# Patient Record
Sex: Male | Born: 1935 | Race: White | Hispanic: Yes | Smoking: Former smoker
Health system: Southern US, Community
[De-identification: ages and names within clinical notes are randomized; demographics above are authoritative.]

## PROBLEM LIST (undated history)

## (undated) DIAGNOSIS — I10 Essential (primary) hypertension: Secondary | ICD-10-CM

## (undated) DIAGNOSIS — E039 Hypothyroidism, unspecified: Secondary | ICD-10-CM

## (undated) DIAGNOSIS — I723 Aneurysm of iliac artery: Secondary | ICD-10-CM

## (undated) DIAGNOSIS — R600 Localized edema: Secondary | ICD-10-CM

## (undated) DIAGNOSIS — R609 Edema, unspecified: Secondary | ICD-10-CM

## (undated) DIAGNOSIS — E669 Obesity, unspecified: Secondary | ICD-10-CM

## (undated) DIAGNOSIS — G4733 Obstructive sleep apnea (adult) (pediatric): Secondary | ICD-10-CM

## (undated) DIAGNOSIS — E119 Type 2 diabetes mellitus without complications: Secondary | ICD-10-CM

## (undated) DIAGNOSIS — E785 Hyperlipidemia, unspecified: Secondary | ICD-10-CM

---

## 2009-02-10 ENCOUNTER — Encounter: Admission: RE | Admit: 2009-02-10 | Discharge: 2009-02-10 | Payer: Self-pay | Admitting: Specialist

## 2012-02-28 ENCOUNTER — Ambulatory Visit
Admission: RE | Admit: 2012-02-28 | Discharge: 2012-02-28 | Disposition: A | Discharge status: Home / Self Care | Payer: Medicare Other | Source: Ambulatory Visit | Attending: Physician Assistant | Admitting: Physician Assistant

## 2012-02-28 ENCOUNTER — Other Ambulatory Visit: Payer: Self-pay | Admitting: Physician Assistant

## 2012-02-28 DIAGNOSIS — Z801 Family history of malignant neoplasm of trachea, bronchus and lung: Secondary | ICD-10-CM

## 2012-02-28 DIAGNOSIS — F17200 Nicotine dependence, unspecified, uncomplicated: Secondary | ICD-10-CM

## 2012-11-23 ENCOUNTER — Other Ambulatory Visit: Payer: Self-pay | Admitting: Otolaryngology

## 2012-11-23 DIAGNOSIS — E041 Nontoxic single thyroid nodule: Secondary | ICD-10-CM

## 2012-11-28 ENCOUNTER — Ambulatory Visit
Admission: RE | Admit: 2012-11-28 | Discharge: 2012-11-28 | Disposition: A | Discharge status: Home / Self Care | Payer: Medicare Other | Source: Ambulatory Visit | Attending: Otolaryngology | Admitting: Otolaryngology

## 2012-11-28 ENCOUNTER — Other Ambulatory Visit (HOSPITAL_COMMUNITY)
Admission: RE | Admit: 2012-11-28 | Discharge: 2012-11-28 | Disposition: A | Discharge status: Home / Self Care | Payer: Medicare Other | Source: Ambulatory Visit | Attending: Interventional Radiology | Admitting: Interventional Radiology

## 2012-11-28 DIAGNOSIS — E041 Nontoxic single thyroid nodule: Secondary | ICD-10-CM

## 2019-03-16 HISTORY — PX: LEFT HEART CATH AND CORONARY ANGIOGRAPHY: CATH118249

## 2021-03-21 ENCOUNTER — Inpatient Hospital Stay (HOSPITAL_COMMUNITY)
Admission: EM | Admit: 2021-03-21 | Discharge: 2021-03-25 | DRG: 064 | Disposition: A | Discharge status: Rehabilitation Facility | Payer: Medicare Other | Attending: Internal Medicine | Admitting: Internal Medicine

## 2021-03-21 ENCOUNTER — Emergency Department (HOSPITAL_COMMUNITY): Payer: Medicare Other

## 2021-03-21 ENCOUNTER — Encounter (HOSPITAL_COMMUNITY): Payer: Self-pay | Admitting: Emergency Medicine

## 2021-03-21 DIAGNOSIS — G4733 Obstructive sleep apnea (adult) (pediatric): Secondary | ICD-10-CM | POA: Diagnosis present

## 2021-03-21 DIAGNOSIS — F17210 Nicotine dependence, cigarettes, uncomplicated: Secondary | ICD-10-CM | POA: Diagnosis present

## 2021-03-21 DIAGNOSIS — Z79890 Hormone replacement therapy (postmenopausal): Secondary | ICD-10-CM

## 2021-03-21 DIAGNOSIS — Z7902 Long term (current) use of antithrombotics/antiplatelets: Secondary | ICD-10-CM | POA: Diagnosis not present

## 2021-03-21 DIAGNOSIS — I63212 Cerebral infarction due to unspecified occlusion or stenosis of left vertebral arteries: Secondary | ICD-10-CM | POA: Diagnosis not present

## 2021-03-21 DIAGNOSIS — E039 Hypothyroidism, unspecified: Secondary | ICD-10-CM | POA: Diagnosis present

## 2021-03-21 DIAGNOSIS — I2510 Atherosclerotic heart disease of native coronary artery without angina pectoris: Secondary | ICD-10-CM | POA: Diagnosis present

## 2021-03-21 DIAGNOSIS — I639 Cerebral infarction, unspecified: Secondary | ICD-10-CM | POA: Diagnosis present

## 2021-03-21 DIAGNOSIS — Z91128 Patient's intentional underdosing of medication regimen for other reason: Secondary | ICD-10-CM

## 2021-03-21 DIAGNOSIS — Z7982 Long term (current) use of aspirin: Secondary | ICD-10-CM | POA: Diagnosis not present

## 2021-03-21 DIAGNOSIS — R29700 NIHSS score 0: Secondary | ICD-10-CM | POA: Diagnosis present

## 2021-03-21 DIAGNOSIS — R2689 Other abnormalities of gait and mobility: Secondary | ICD-10-CM | POA: Diagnosis present

## 2021-03-21 DIAGNOSIS — Z20822 Contact with and (suspected) exposure to covid-19: Secondary | ICD-10-CM | POA: Diagnosis present

## 2021-03-21 DIAGNOSIS — G4700 Insomnia, unspecified: Secondary | ICD-10-CM | POA: Diagnosis present

## 2021-03-21 DIAGNOSIS — G936 Cerebral edema: Secondary | ICD-10-CM | POA: Diagnosis present

## 2021-03-21 DIAGNOSIS — M62838 Other muscle spasm: Secondary | ICD-10-CM | POA: Diagnosis present

## 2021-03-21 DIAGNOSIS — IMO0001 Reserved for inherently not codable concepts without codable children: Secondary | ICD-10-CM | POA: Diagnosis present

## 2021-03-21 DIAGNOSIS — Z7984 Long term (current) use of oral hypoglycemic drugs: Secondary | ICD-10-CM | POA: Diagnosis not present

## 2021-03-21 DIAGNOSIS — K219 Gastro-esophageal reflux disease without esophagitis: Secondary | ICD-10-CM | POA: Diagnosis present

## 2021-03-21 DIAGNOSIS — Z7951 Long term (current) use of inhaled steroids: Secondary | ICD-10-CM | POA: Diagnosis not present

## 2021-03-21 DIAGNOSIS — I63542 Cerebral infarction due to unspecified occlusion or stenosis of left cerebellar artery: Secondary | ICD-10-CM | POA: Diagnosis not present

## 2021-03-21 DIAGNOSIS — E785 Hyperlipidemia, unspecified: Secondary | ICD-10-CM | POA: Diagnosis present

## 2021-03-21 DIAGNOSIS — I69398 Other sequelae of cerebral infarction: Secondary | ICD-10-CM | POA: Diagnosis present

## 2021-03-21 DIAGNOSIS — E119 Type 2 diabetes mellitus without complications: Secondary | ICD-10-CM

## 2021-03-21 DIAGNOSIS — Z823 Family history of stroke: Secondary | ICD-10-CM | POA: Diagnosis not present

## 2021-03-21 DIAGNOSIS — I6389 Other cerebral infarction: Secondary | ICD-10-CM | POA: Diagnosis not present

## 2021-03-21 DIAGNOSIS — Z6841 Body Mass Index (BMI) 40.0 and over, adult: Secondary | ICD-10-CM | POA: Diagnosis not present

## 2021-03-21 DIAGNOSIS — T383X6A Underdosing of insulin and oral hypoglycemic [antidiabetic] drugs, initial encounter: Secondary | ICD-10-CM | POA: Diagnosis present

## 2021-03-21 DIAGNOSIS — I69393 Ataxia following cerebral infarction: Secondary | ICD-10-CM | POA: Diagnosis not present

## 2021-03-21 DIAGNOSIS — E669 Obesity, unspecified: Secondary | ICD-10-CM | POA: Diagnosis present

## 2021-03-21 DIAGNOSIS — F17200 Nicotine dependence, unspecified, uncomplicated: Secondary | ICD-10-CM

## 2021-03-21 DIAGNOSIS — Z79899 Other long term (current) drug therapy: Secondary | ICD-10-CM

## 2021-03-21 DIAGNOSIS — K5900 Constipation, unspecified: Secondary | ICD-10-CM | POA: Diagnosis not present

## 2021-03-21 DIAGNOSIS — E1165 Type 2 diabetes mellitus with hyperglycemia: Secondary | ICD-10-CM | POA: Diagnosis present

## 2021-03-21 DIAGNOSIS — Z9114 Patient's other noncompliance with medication regimen: Secondary | ICD-10-CM | POA: Diagnosis not present

## 2021-03-21 DIAGNOSIS — I10 Essential (primary) hypertension: Secondary | ICD-10-CM | POA: Diagnosis present

## 2021-03-21 DIAGNOSIS — J449 Chronic obstructive pulmonary disease, unspecified: Secondary | ICD-10-CM | POA: Diagnosis present

## 2021-03-21 DIAGNOSIS — E1159 Type 2 diabetes mellitus with other circulatory complications: Secondary | ICD-10-CM | POA: Diagnosis not present

## 2021-03-21 DIAGNOSIS — Z794 Long term (current) use of insulin: Secondary | ICD-10-CM | POA: Diagnosis not present

## 2021-03-21 HISTORY — DX: Essential (primary) hypertension: I10

## 2021-03-21 HISTORY — DX: Obesity, unspecified: E66.9

## 2021-03-21 HISTORY — DX: Edema, unspecified: R60.9

## 2021-03-21 HISTORY — DX: Aneurysm of iliac artery: I72.3

## 2021-03-21 HISTORY — DX: Hypothyroidism, unspecified: E03.9

## 2021-03-21 HISTORY — DX: Localized edema: R60.0

## 2021-03-21 HISTORY — DX: Type 2 diabetes mellitus without complications: E11.9

## 2021-03-21 HISTORY — DX: Hyperlipidemia, unspecified: E78.5

## 2021-03-21 HISTORY — DX: Obstructive sleep apnea (adult) (pediatric): G47.33

## 2021-03-21 LAB — COMPREHENSIVE METABOLIC PANEL
ALT: 63 U/L — ABNORMAL HIGH (ref 0–44)
AST: 46 U/L — ABNORMAL HIGH (ref 15–41)
Albumin: 3.4 g/dL — ABNORMAL LOW (ref 3.5–5.0)
Alkaline Phosphatase: 89 U/L (ref 38–126)
Anion gap: 10 (ref 5–15)
BUN: 9 mg/dL (ref 8–23)
CO2: 26 mmol/L (ref 22–32)
Calcium: 9.1 mg/dL (ref 8.9–10.3)
Chloride: 99 mmol/L (ref 98–111)
Creatinine, Ser: 0.78 mg/dL (ref 0.61–1.24)
GFR, Estimated: >60 mL/min (ref >60)
Glucose, Bld: 254 mg/dL — ABNORMAL HIGH (ref 70–99)
Potassium: 4 mmol/L (ref 3.5–5.1)
Sodium: 135 mmol/L (ref 135–145)
Total Bilirubin: 0.8 mg/dL (ref 0.3–1.2)
Total Protein: 7.1 g/dL (ref 6.5–8.1)

## 2021-03-21 LAB — CBC
HCT: 46.5 % (ref 39.0–52.0)
Hemoglobin: 15.2 g/dL (ref 13.0–17.0)
MCH: 30 pg (ref 26.0–34.0)
MCHC: 32.7 g/dL (ref 30.0–36.0)
MCV: 91.7 fL (ref 80.0–100.0)
Platelets: 192 10*3/uL (ref 150–400)
RBC: 5.07 MIL/uL (ref 4.22–5.81)
RDW: 15.3 % (ref 11.5–15.5)
WBC: 10.5 10*3/uL (ref 4.0–10.5)
nRBC: 0 % (ref 0.0–0.2)

## 2021-03-21 LAB — I-STAT CHEM 8, ED
BUN: 11 mg/dL (ref 8–23)
Calcium, Ion: 1.15 mmol/L (ref 1.15–1.40)
Chloride: 98 mmol/L (ref 98–111)
Creatinine, Ser: 0.6 mg/dL — ABNORMAL LOW (ref 0.61–1.24)
Glucose, Bld: 256 mg/dL — ABNORMAL HIGH (ref 70–99)
HCT: 49 % (ref 39.0–52.0)
Hemoglobin: 16.7 g/dL (ref 13.0–17.0)
Potassium: 3.9 mmol/L (ref 3.5–5.1)
Sodium: 135 mmol/L (ref 135–145)
TCO2: 27 mmol/L (ref 22–32)

## 2021-03-21 LAB — DIFFERENTIAL
Abs Immature Granulocytes: 0.05 10*3/uL (ref 0.00–0.07)
Basophils Absolute: 0 10*3/uL (ref 0.0–0.1)
Basophils Relative: 0 %
Eosinophils Absolute: 0 10*3/uL (ref 0.0–0.5)
Eosinophils Relative: 0 %
Immature Granulocytes: 1 %
Lymphocytes Relative: 15 %
Lymphs Abs: 1.5 10*3/uL (ref 0.7–4.0)
Monocytes Absolute: 0.3 10*3/uL (ref 0.1–1.0)
Monocytes Relative: 3 %
Neutro Abs: 8.6 10*3/uL — ABNORMAL HIGH (ref 1.7–7.7)
Neutrophils Relative %: 81 %

## 2021-03-21 LAB — CBG MONITORING, ED: Glucose-Capillary: 201 mg/dL — ABNORMAL HIGH (ref 70–99)

## 2021-03-21 LAB — TROPONIN I (HIGH SENSITIVITY): Troponin I (High Sensitivity): 21 ng/L — ABNORMAL HIGH (ref <18)

## 2021-03-21 LAB — PROTIME-INR
INR: 1 (ref 0.8–1.2)
Prothrombin Time: 13 seconds (ref 11.4–15.2)

## 2021-03-21 LAB — SEDIMENTATION RATE: Sed Rate: 5 mm/hr (ref 0–16)

## 2021-03-21 LAB — APTT: aPTT: 27 seconds (ref 24–36)

## 2021-03-21 MED ORDER — ACETAMINOPHEN 325 MG PO TABS
650.0000 mg | ORAL_TABLET | ORAL | Status: DC | PRN
  Administered 2021-03-23 – 2021-03-25 (×3): 650 mg via ORAL
  Filled 2021-03-21 (×3): qty 2

## 2021-03-21 MED ORDER — INSULIN ASPART 100 UNIT/ML IJ SOLN
0.0000 [IU] | Freq: Three times a day (TID) | INTRAMUSCULAR | Status: DC
  Administered 2021-03-22 (×2): 3 [IU] via SUBCUTANEOUS
  Administered 2021-03-22 – 2021-03-23 (×2): 5 [IU] via SUBCUTANEOUS
  Administered 2021-03-23 – 2021-03-25 (×8): 3 [IU] via SUBCUTANEOUS

## 2021-03-21 MED ORDER — ASPIRIN EC 81 MG PO TBEC
81.0000 mg | DELAYED_RELEASE_TABLET | Freq: Every day | ORAL | Status: DC
  Administered 2021-03-22: 81 mg via ORAL
  Filled 2021-03-21: qty 1

## 2021-03-21 MED ORDER — ASPIRIN 325 MG PO TABS: 325.0000 mg | ORAL_TABLET | Freq: Once | ORAL | Status: AC

## 2021-03-21 MED ORDER — FLUOROMETHOLONE 0.1 % OP SUSP
1.0000 [drp] | Freq: Every day | OPHTHALMIC | Status: DC
  Administered 2021-03-22 – 2021-03-25 (×4): 1 [drp] via OPHTHALMIC
  Filled 2021-03-21: qty 5

## 2021-03-21 MED ORDER — LORATADINE 10 MG PO TABS: 10.0000 mg | ORAL_TABLET | Freq: Every day | ORAL | Status: DC | PRN

## 2021-03-21 MED ORDER — SODIUM CHLORIDE 0.9% FLUSH
3.0000 mL | Freq: Once | INTRAVENOUS | Status: AC
  Administered 2021-03-21: 3 mL via INTRAVENOUS

## 2021-03-21 MED ORDER — FLUTICASONE PROPIONATE 50 MCG/ACT NA SUSP
1.0000 | Freq: Every day | NASAL | Status: DC | PRN
  Filled 2021-03-21: qty 16

## 2021-03-21 MED ORDER — ENOXAPARIN SODIUM 40 MG/0.4ML IJ SOSY
40.0000 mg | PREFILLED_SYRINGE | INTRAMUSCULAR | Status: DC
  Administered 2021-03-22 – 2021-03-24 (×3): 40 mg via SUBCUTANEOUS
  Filled 2021-03-21 (×3): qty 0.4

## 2021-03-21 MED ORDER — CLOPIDOGREL BISULFATE 75 MG PO TABS
75.0000 mg | ORAL_TABLET | Freq: Every day | ORAL | Status: DC
  Administered 2021-03-22 – 2021-03-25 (×4): 75 mg via ORAL
  Filled 2021-03-21 (×4): qty 1

## 2021-03-21 MED ORDER — STROKE: EARLY STAGES OF RECOVERY BOOK
Freq: Once | Status: AC
  Administered 2021-03-22: 1
  Filled 2021-03-21 (×3): qty 1

## 2021-03-21 MED ORDER — INSULIN ASPART 100 UNIT/ML IJ SOLN
0.0000 [IU] | Freq: Every day | INTRAMUSCULAR | Status: DC
  Administered 2021-03-21 – 2021-03-22 (×2): 2 [IU] via SUBCUTANEOUS

## 2021-03-21 MED ORDER — ATORVASTATIN CALCIUM 40 MG PO TABS: 80.0000 mg | ORAL_TABLET | Freq: Every day | ORAL | Status: DC

## 2021-03-21 MED ORDER — FENTANYL CITRATE PF 50 MCG/ML IJ SOSY
25.0000 ug | PREFILLED_SYRINGE | Freq: Once | INTRAMUSCULAR | Status: AC
  Administered 2021-03-21: 25 ug via INTRAVENOUS
  Filled 2021-03-21: qty 1

## 2021-03-21 MED ORDER — PANTOPRAZOLE SODIUM 40 MG PO TBEC
40.0000 mg | DELAYED_RELEASE_TABLET | Freq: Every day | ORAL | Status: DC
  Administered 2021-03-22 – 2021-03-25 (×4): 40 mg via ORAL
  Filled 2021-03-21 (×4): qty 1

## 2021-03-21 MED ORDER — ASPIRIN 325 MG PO TABS
ORAL_TABLET | ORAL | Status: AC
  Administered 2021-03-21: 325 mg via ORAL
  Filled 2021-03-21: qty 1

## 2021-03-21 MED ORDER — LEVOTHYROXINE SODIUM 88 MCG PO TABS
88.0000 ug | ORAL_TABLET | Freq: Every day | ORAL | Status: DC
  Administered 2021-03-22 – 2021-03-25 (×4): 88 ug via ORAL
  Filled 2021-03-21 (×5): qty 1

## 2021-03-21 MED ORDER — ATORVASTATIN CALCIUM 80 MG PO TABS
80.0000 mg | ORAL_TABLET | Freq: Every day | ORAL | Status: DC
  Administered 2021-03-21 – 2021-03-24 (×4): 80 mg via ORAL
  Filled 2021-03-21 (×2): qty 1
  Filled 2021-03-21: qty 2
  Filled 2021-03-21: qty 1

## 2021-03-21 MED ORDER — ACETAMINOPHEN 650 MG RE SUPP: 650.0000 mg | RECTAL | Status: DC | PRN

## 2021-03-21 MED ORDER — MOMETASONE FURO-FORMOTEROL FUM 100-5 MCG/ACT IN AERO
2.0000 | INHALATION_SPRAY | Freq: Two times a day (BID) | RESPIRATORY_TRACT | Status: DC
  Administered 2021-03-22 – 2021-03-25 (×7): 2 via RESPIRATORY_TRACT
  Filled 2021-03-21: qty 8.8

## 2021-03-21 MED ORDER — ACETAMINOPHEN 160 MG/5ML PO SOLN: 650.0000 mg | ORAL | Status: DC | PRN

## 2021-03-21 MED ORDER — FENTANYL CITRATE PF 50 MCG/ML IJ SOSY
25.0000 ug | PREFILLED_SYRINGE | INTRAMUSCULAR | Status: DC | PRN
Start: 2021-03-21 — End: 2021-03-25
  Administered 2021-03-25: 25 ug via INTRAVENOUS
  Filled 2021-03-21: qty 1

## 2021-03-21 NOTE — ED Triage Notes (Signed)
Pt arrives via EMS from home with sudden HA at 8 am today and dizziness. Using translator, pt reports feeling off balance and left side head pain. States he is unable to keep anything down.

## 2021-03-21 NOTE — ED Provider Notes (Signed)
[facility] EMERGENCY DEPARTMENT Provider Note   CSN: [id number] Arrival date & time: 03/21/21  1616     History  No chief complaint on file.   Samuel Norris is a 86 y.o. male.  HPI Presents with 1 day of unsteadiness, headache, nausea and vomiting.  Onset was 8 AM, approximately 10 hours ago.  Went to bed feeling well, no recent changes in medicine.  He does smoke, but is otherwise generally awake, alert, functional.  Today, symptoms been persistent, with a focal pain in the left posterior head.  No ability to take medication, with post intake vomiting.  No confusion, no focal weakness, though he feels unsteady on his feet.    Home Medications Prior to Admission medications   Not on File      Allergies    Patient has no allergy information on record.    Review of Systems   Review of Systems  Constitutional:        Per HPI, otherwise negative  HENT:         Per HPI, otherwise negative  Respiratory:         Per HPI, otherwise negative  Cardiovascular:        Per HPI, otherwise negative  Gastrointestinal:  Negative for vomiting.  Endocrine:       Negative aside from HPI  Genitourinary:        Neg aside from HPI   Musculoskeletal:        Per HPI, otherwise negative  Skin: Negative.   Neurological:  Negative for syncope.   Physical Exam Updated Vital Signs BP (!) 193/77    Pulse (!) 55    Temp 98.5 F (36.9 C) (Oral)    Resp 20    Ht 5\' 7"  (1.702 m)    Wt 120.2 kg    SpO2 96%    BMI 41.50 kg/m  Physical Exam Vitals and nursing note reviewed.  Constitutional:      General: He is not in acute distress.    Appearance: He is well-developed.  HENT:     Head: Normocephalic and atraumatic.  Eyes:     Conjunctiva/sclera: Conjunctivae normal.  Cardiovascular:     Rate and Rhythm: Normal rate and regular rhythm.  Pulmonary:     Effort: Pulmonary effort is normal. No respiratory distress.     Breath sounds: No stridor.  Abdominal:     General:  There is no distension.  Skin:    General: Skin is warm and dry.  Neurological:     Mental Status: He is alert and oriented to person, place, and time.     Cranial Nerves: No cranial nerve deficit.     Motor: No weakness.     Comments: Patient unwilling to attempt gait testing    ED Results / Procedures / Treatments   Labs (all labs ordered are listed, but only abnormal results are displayed) Labs Reviewed  DIFFERENTIAL - Abnormal; Notable for the following components:      Result Value   Neutro Abs 8.6 (*)    All other components within normal limits  COMPREHENSIVE METABOLIC PANEL - Abnormal; Notable for the following components:   Glucose, Bld 254 (*)    Albumin 3.4 (*)    AST 46 (*)    ALT 63 (*)    All other components within normal limits  I-STAT CHEM 8, ED - Abnormal; Notable for the following components:   Creatinine, Ser 0.60 (*)    Glucose, Bld  256 (*)    All other components within normal limits  PROTIME-INR  APTT  CBC  SEDIMENTATION RATE  CBG MONITORING, ED    EKG EKG Interpretation  Date/Time:  Saturday March 21 2021 16:25:49 EST Ventricular Rate:  59 PR Interval:  200 QRS Duration: 94 QT Interval:  450 QTC Calculation: 445 R Axis:   -14 Text Interpretation: Sinus bradycardia Minimal voltage criteria for LVH, may be normal variant ( R in aVL ) Borderline ECG No previous ECGs available Confirmed by Gerhard MunchLockwood, Tiffiany Beadles [phone]([id number]) on 03/21/2021 6:16:09 PM  Radiology CT HEAD WO CONTRAST  Result Date: 03/21/2021 CLINICAL DATA:  Neuro deficit, acute, stroke suspected Patient reports sudden onset of headache and dizziness. EXAM: CT HEAD WITHOUT CONTRAST TECHNIQUE: Contiguous axial images were obtained from the base of the skull through the vertex without intravenous contrast. COMPARISON:  None. FINDINGS: Brain: Focal area of low-density and loss of gray-white differentiation in the right occipital lobe, series 3, image 18 and series 5, image 49. No acute intracranial  hemorrhage. Generalized atrophy, normal for age. Mild periventricular white matter hypodensity typical of chronic small vessel ischemia. No subdural or extra-axial collection. No hydrocephalus. Basilar cisterns are patent. Vascular: No hyperdense vessel or unexpected calcification. Skull: No fracture or focal lesion. Sinuses/Orbits: Trace mucosal thickening of maxillary sinuses. No sinus fluid levels. No mastoid effusion. Bilateral cataract resection. Other: Small left frontal scalp lipoma. IMPRESSION: 1. Focal area of low-density and loss of gray-white differentiation in the right occipital lobe, suspicious for infarct, likely subacute. Consider further evaluation with MRI. 2. Age related atrophy and chronic small vessel ischemia. Electronically Signed   By: Narda RutherfordMelanie  Sanford M.D.   On: 03/21/2021 17:19   MR ANGIO HEAD WO CONTRAST  Result Date: 03/21/2021 CLINICAL DATA:  Acute neurologic deficit EXAM: MRI HEAD WITHOUT CONTRAST MRA HEAD WITHOUT CONTRAST TECHNIQUE: Multiplanar, multi-echo pulse sequences of the brain and surrounding structures were acquired without intravenous contrast. Angiographic images of the Circle of Willis were acquired using MRA technique without intravenous contrast. COMPARISON:  No pertinent prior exam. FINDINGS: MRI HEAD FINDINGS Brain: Small area of acute ischemia involving the inferior medial left cerebellar hemisphere. No acute or chronic hemorrhage. Normal white matter signal. Generalized volume loss without a clear lobar predilection. The midline structures are normal. Skull and upper cervical spine: Normal calvarium and skull base. Visualized upper cervical spine and soft tissues are normal. Sinuses/Orbits:No paranasal sinus fluid levels or advanced mucosal thickening. No mastoid or middle ear effusion. Normal orbits. MRA HEAD FINDINGS POSTERIOR CIRCULATION: --Vertebral arteries: Diminished flow related enhancement of the left vertebral artery V4 segment. Normal right V4.  --Inferior cerebellar arteries: Normal. --Basilar artery: Normal. --Superior cerebellar arteries: Normal. --Posterior cerebral arteries: Normal. ANTERIOR CIRCULATION: --Intracranial internal carotid arteries: Normal. --Anterior cerebral arteries (ACA): Normal. --Middle cerebral arteries (MCA): Normal. ANATOMIC VARIANTS: None IMPRESSION: 1. Small acute infarct of the inferior medial left cerebellar hemisphere. No hemorrhage or mass effect. 2. Diminished flow related enhancement of the left vertebral artery V4 segment, consistent with high-grade stenosis. 3. Otherwise normal intracranial MRA. Electronically Signed   By: Deatra RobinsonKevin  Herman M.D.   On: 03/21/2021 21:26   MR BRAIN WO CONTRAST  Result Date: 03/21/2021 CLINICAL DATA:  Acute neurologic deficit EXAM: MRI HEAD WITHOUT CONTRAST MRA HEAD WITHOUT CONTRAST TECHNIQUE: Multiplanar, multi-echo pulse sequences of the brain and surrounding structures were acquired without intravenous contrast. Angiographic images of the Circle of Willis were acquired using MRA technique without intravenous contrast. COMPARISON:  No pertinent prior exam. FINDINGS: MRI  HEAD FINDINGS Brain: Small area of acute ischemia involving the inferior medial left cerebellar hemisphere. No acute or chronic hemorrhage. Normal white matter signal. Generalized volume loss without a clear lobar predilection. The midline structures are normal. Skull and upper cervical spine: Normal calvarium and skull base. Visualized upper cervical spine and soft tissues are normal. Sinuses/Orbits:No paranasal sinus fluid levels or advanced mucosal thickening. No mastoid or middle ear effusion. Normal orbits. MRA HEAD FINDINGS POSTERIOR CIRCULATION: --Vertebral arteries: Diminished flow related enhancement of the left vertebral artery V4 segment. Normal right V4. --Inferior cerebellar arteries: Normal. --Basilar artery: Normal. --Superior cerebellar arteries: Normal. --Posterior cerebral arteries: Normal. ANTERIOR  CIRCULATION: --Intracranial internal carotid arteries: Normal. --Anterior cerebral arteries (ACA): Normal. --Middle cerebral arteries (MCA): Normal. ANATOMIC VARIANTS: None IMPRESSION: 1. Small acute infarct of the inferior medial left cerebellar hemisphere. No hemorrhage or mass effect. 2. Diminished flow related enhancement of the left vertebral artery V4 segment, consistent with high-grade stenosis. 3. Otherwise normal intracranial MRA. Electronically Signed   By: Deatra Robinson M.D.   On: 03/21/2021 21:26    Procedures Procedures    Medications Ordered in ED Medications  sodium chloride flush (NS) 0.9 % injection 3 mL (has no administration in time range)  fentaNYL (SUBLIMAZE) injection 25 mcg (25 mcg Intravenous Given 03/21/21 1820)   9:33 PM Patient unable to ambulate on repeat exam  : I discussed this case again with our neurology colleagues, with concern for acute cerebellar infarct patient will be admitted.  Patient and wife are aware. ED Course/ Medical Decision Making/ A&P Cardiac55 sinus unremarkable Pulse ox 95% room air normal                          Medical Decision Making Adult male presents with new headache, gait instability, broad differential initial with encephalopathy, stroke, vasculopathy, infection all considered.  CT reviewed concerning for possible stroke, MRI subsequently performed, both studies interpreted, evaluated by myself consistent with acute stroke, occipital lobe this consistent with patient's physical exam findings of gait difficulty, otherwise reassuring General neurologic exam.  No evidence for concurrent infection.  Case discussed several times with our neurology colleagues, and internal medicine prior to admission for monitoring, management further evaluation of his acute stroke        Final Clinical Impression(s) / ED Diagnoses Final diagnoses:  Acute CVA (cerebrovascular accident) [facility])     Gerhard Munch, MD 03/21/21 2134

## 2021-03-21 NOTE — H&P (Signed)
History and Physical    Samuel Norris FAO:[id number] DOB: November 11, 1935 DOA: 03/21/2021  PCP: System, Provider Not In  Patient coming from: Home  I [facility] personally briefly [facility]  Chief Complaint: Vertigo  HPI: Samuel AbrahamsFernando Norris is a 86 y.o. male with medical history significant of DM2, HTN, HLD, obesity, OSA non-adherent to CPAP, smoking just quit 3 days ago.  Pt presents to ED with 1 day h/o unsteadiness, headache, N/V.  Symptoms onset ~8am.  Went to bed last night feeling well.  No recent medication changes.  Quit smoking 3 days ago  Symptoms constant, persistent, moderate.  Presents to ED.   ED Course: Pt with acute ischemic cerebellar stroke, high grade L V4 stenosis.   Review of Systems: As per HPI, otherwise all review of systems negative.  Past Medical History:  Diagnosis Date   Bilateral iliac artery aneurysm (HCC)    Diabetes mellitus without complication (HCC)    HTN (hypertension)    Hyperlipidemia    Hypothyroid    Obesity    OSA (obstructive sleep apnea)    Peripheral edema     Past Surgical History:  Procedure Laterality Date   LEFT HEART CATH AND CORONARY ANGIOGRAPHY  03/2019   Mild Non-obstructive CAD     reports that he has been smoking cigarettes. He does not have any smokeless tobacco history on file. He reports that he does not drink alcohol and does not use drugs.  No Known Allergies  Family History  Problem Relation Age of Onset   Gout Other      Prior to Admission medications   Medication Sig Start Date End Date Taking? Authorizing Provider  atorvastatin (LIPITOR) 80 MG tablet Take 80 mg by mouth at bedtime. 01/03/21   [provider]  benzonatate (TESSALON) 100 MG capsule Take by mouth. 01/23/21   [provider]  budesonide-formoterol (SYMBICORT) 80-4.5 MCG/ACT inhaler Inhale 2 puffs into the lungs 2 (two) times daily.    [provider]  diclofenac  (VOLTAREN) 75 MG EC tablet Take 75 mg by mouth 2 (two) times daily. 12/30/20   [provider]  fluorometholone (FML) 0.1 % ophthalmic suspension SMARTSIG:In Eye(s) 03/13/21   [provider]  fluticasone (FLONASE) 50 MCG/ACT nasal spray Place 1 spray into both nostrils daily. 01/23/21   [provider]  furosemide (LASIX) 20 MG tablet Take 20 mg by mouth daily. 12/30/20   [provider]  levothyroxine (SYNTHROID) 88 MCG tablet Take 88 mcg by mouth daily. 01/02/21   [provider]  losartan (COZAAR) 100 MG tablet Take 100 mg by mouth daily. 03/20/21   [provider]  metFORMIN (GLUCOPHAGE) 500 MG tablet Take 500 mg by mouth 2 (two) times daily. 01/23/21   [provider]  omeprazole (PRILOSEC) 20 MG capsule Take 40 mg by mouth daily. 12/30/20   [provider]  potassium chloride (KLOR-CON) 10 MEQ tablet Take 10 mEq by mouth daily. 02/16/21   [provider]  sitaGLIPtin (JANUVIA) 100 MG tablet Take 100 mg by mouth daily.    [provider]    Physical Exam: Vitals:   03/21/21 1624 03/21/21 1717 03/21/21 1815 03/21/21 1830  BP: (!) 172/79 (!) 181/71 (!) 186/87 (!) 193/77  Pulse: (!) 56 (!) 55 61 (!) 55  Resp: 16 16 17 20   Temp: 98.5 F (36.9 C)     TempSrc: Oral     SpO2: 96% 93% 97% 96%  Weight:  Height:        Constitutional: NAD, calm, comfortable Eyes: PERRL, lids and conjunctivae normal ENMT: Mucous membranes are moist. Posterior pharynx clear of any exudate or lesions.Normal dentition.  Neck: normal, supple, no masses, no thyromegaly Respiratory: clear to auscultation bilaterally, no wheezing, no crackles. Normal respiratory effort. No accessory muscle use.  Cardiovascular: Regular rate and rhythm, no murmurs / rubs / gallops. No extremity edema. 2+ pedal pulses. No carotid bruits.  Abdomen: no tenderness, no masses palpated. No hepatosplenomegaly. Bowel sounds positive.   Musculoskeletal: no clubbing / cyanosis. No joint deformity upper and lower extremities. Good ROM, no contractures. Normal muscle tone.  Skin: no rashes, lesions, ulcers. No induration Neurologic: Ataxia in LUE and LLE Psychiatric: Normal judgment and insight. Alert and oriented x 3. Normal mood.    Labs on Admission: I have personally reviewed following labs and imaging studies  CBC: Recent Labs  Lab 03/21/21 1635 03/21/21 1655  WBC 10.5  --   NEUTROABS 8.6*  --   HGB 15.2 16.7  HCT 46.5 49.0  MCV 91.7  --   PLT 192  --    Basic Metabolic Panel: Recent Labs  Lab 03/21/21 1635 03/21/21 1655  NA 135 135  K 4.0 3.9  CL 99 98  CO2 26  --   GLUCOSE 254* 256*  BUN 9 11  CREATININE 0.78 0.60*  CALCIUM 9.1  --    GFR: Estimated Creatinine Clearance: 86.8 mL/min (A) (by C-G formula based on SCr of 0.6 mg/dL (L)). Liver Function Tests: Recent Labs  Lab 03/21/21 1635  AST 46*  ALT 63*  ALKPHOS 89  BILITOT 0.8  PROT 7.1  ALBUMIN 3.4*   No results for input(s): LIPASE, AMYLASE in the last 168 hours. No results for input(s): AMMONIA in the last 168 hours. Coagulation Profile: Recent Labs  Lab 03/21/21 1635  INR 1.0   Cardiac Enzymes: No results for input(s): CKTOTAL, CKMB, CKMBINDEX, TROPONINI in the last 168 hours. BNP (last 3 results) No results for input(s): PROBNP in the last 8760 hours. HbA1C: No results for input(s): HGBA1C in the last 72 hours. CBG: No results for input(s): GLUCAP in the last 168 hours. Lipid Profile: No results for input(s): CHOL, HDL, LDLCALC, TRIG, CHOLHDL, LDLDIRECT in the last 72 hours. Thyroid Function Tests: No results for input(s): TSH, T4TOTAL, FREET4, T3FREE, THYROIDAB in the last 72 hours. Anemia Panel: No results for input(s): VITAMINB12, FOLATE, FERRITIN, TIBC, IRON, RETICCTPCT in the last 72 hours. Urine analysis: No results found for: COLORURINE, APPEARANCEUR, LABSPEC, PHURINE, GLUCOSEU, HGBUR, BILIRUBINUR, KETONESUR,  PROTEINUR, UROBILINOGEN, NITRITE, LEUKOCYTESUR  Radiological Exams on Admission: CT HEAD WO CONTRAST  Result Date: 03/21/2021 CLINICAL DATA:  Neuro deficit, acute, stroke suspected Patient reports sudden onset of headache and dizziness. EXAM: CT HEAD WITHOUT CONTRAST TECHNIQUE: Contiguous axial images were obtained from the base of the skull through the vertex without intravenous contrast. COMPARISON:  None. FINDINGS: Brain: Focal area of low-density and loss of gray-white differentiation in the right occipital lobe, series 3, image 18 and series 5, image 49. No acute intracranial hemorrhage. Generalized atrophy, normal for age. Mild periventricular white matter hypodensity typical of chronic small vessel ischemia. No subdural or extra-axial collection. No hydrocephalus. Basilar cisterns are patent. Vascular: No hyperdense vessel or unexpected calcification. Skull: No fracture or focal lesion. Sinuses/Orbits: Trace mucosal thickening of maxillary sinuses. No sinus fluid levels. No mastoid effusion. Bilateral cataract resection. Other: Small left frontal scalp lipoma. IMPRESSION: 1. Focal area of low-density and loss  of gray-white differentiation in the right occipital lobe, suspicious for infarct, likely subacute. Consider further evaluation with MRI. 2. Age related atrophy and chronic small vessel ischemia. Electronically Signed   By: Narda RutherfordMelanie  Sanford M.D.   On: 03/21/2021 17:19   MR ANGIO HEAD WO CONTRAST  Result Date: 03/21/2021 CLINICAL DATA:  Acute neurologic deficit EXAM: MRI HEAD WITHOUT CONTRAST MRA HEAD WITHOUT CONTRAST TECHNIQUE: Multiplanar, multi-echo pulse sequences of the brain and surrounding structures were acquired without intravenous contrast. Angiographic images of the Circle of Willis were acquired using MRA technique without intravenous contrast. COMPARISON:  No pertinent prior exam. FINDINGS: MRI HEAD FINDINGS Brain: Small area of acute ischemia involving the inferior medial left  cerebellar hemisphere. No acute or chronic hemorrhage. Normal white matter signal. Generalized volume loss without a clear lobar predilection. The midline structures are normal. Skull and upper cervical spine: Normal calvarium and skull base. Visualized upper cervical spine and soft tissues are normal. Sinuses/Orbits:No paranasal sinus fluid levels or advanced mucosal thickening. No mastoid or middle ear effusion. Normal orbits. MRA HEAD FINDINGS POSTERIOR CIRCULATION: --Vertebral arteries: Diminished flow related enhancement of the left vertebral artery V4 segment. Normal right V4. --Inferior cerebellar arteries: Normal. --Basilar artery: Normal. --Superior cerebellar arteries: Normal. --Posterior cerebral arteries: Normal. ANTERIOR CIRCULATION: --Intracranial internal carotid arteries: Normal. --Anterior cerebral arteries (ACA): Normal. --Middle cerebral arteries (MCA): Normal. ANATOMIC VARIANTS: None IMPRESSION: 1. Small acute infarct of the inferior medial left cerebellar hemisphere. No hemorrhage or mass effect. 2. Diminished flow related enhancement of the left vertebral artery V4 segment, consistent with high-grade stenosis. 3. Otherwise normal intracranial MRA. Electronically Signed   By: Deatra RobinsonKevin  Herman M.D.   On: 03/21/2021 21:26   MR BRAIN WO CONTRAST  Result Date: 03/21/2021 CLINICAL DATA:  Acute neurologic deficit EXAM: MRI HEAD WITHOUT CONTRAST MRA HEAD WITHOUT CONTRAST TECHNIQUE: Multiplanar, multi-echo pulse sequences of the brain and surrounding structures were acquired without intravenous contrast. Angiographic images of the Circle of Willis were acquired using MRA technique without intravenous contrast. COMPARISON:  No pertinent prior exam. FINDINGS: MRI HEAD FINDINGS Brain: Small area of acute ischemia involving the inferior medial left cerebellar hemisphere. No acute or chronic hemorrhage. Normal white matter signal. Generalized volume loss without a clear lobar predilection. The midline  structures are normal. Skull and upper cervical spine: Normal calvarium and skull base. Visualized upper cervical spine and soft tissues are normal. Sinuses/Orbits:No paranasal sinus fluid levels or advanced mucosal thickening. No mastoid or middle ear effusion. Normal orbits. MRA HEAD FINDINGS POSTERIOR CIRCULATION: --Vertebral arteries: Diminished flow related enhancement of the left vertebral artery V4 segment. Normal right V4. --Inferior cerebellar arteries: Normal. --Basilar artery: Normal. --Superior cerebellar arteries: Normal. --Posterior cerebral arteries: Normal. ANTERIOR CIRCULATION: --Intracranial internal carotid arteries: Normal. --Anterior cerebral arteries (ACA): Normal. --Middle cerebral arteries (MCA): Normal. ANATOMIC VARIANTS: None IMPRESSION: 1. Small acute infarct of the inferior medial left cerebellar hemisphere. No hemorrhage or mass effect. 2. Diminished flow related enhancement of the left vertebral artery V4 segment, consistent with high-grade stenosis. 3. Otherwise normal intracranial MRA. Electronically Signed   By: Deatra RobinsonKevin  Herman M.D.   On: 03/21/2021 21:26    EKG: Independently reviewed.  Assessment/Plan Principal Problem:   Acute ischemic stroke (HCC) Active Problems:   DM2 (diabetes mellitus, type 2) (HCC)   HTN (hypertension)   HLD (hyperlipidemia)   OSA (obstructive sleep apnea)   Hypothyroidism   Smoking   COPD (chronic obstructive pulmonary disease) (HCC)    Acute ischemic stroke - Stroke pathway Neuro consult ASA  325 now then 81 daily Plavix 75 for 21 days Carotid dopplers 2d echo Tele monitor PT/OT/SLP HTN - Holding home BP meds and allowing permissive HTN in setting of acute stroke DM2 - Hold metformin and januvia Mod scale SSI AC/HS Check A1C in AM (had been 7.9 a couple of months ago) HLD - Cont lipitor 80 QHS Checking FLP in AM OSA - Non-adherent to CPAP Hypothyroidism - cont synthroid Smoking - Quit smoking 3 days ago Offered nicotine  patch but pt declined COPD - Cont home nebs  DVT prophylaxis: Lovenox Code Status: Full Family Communication: Family at bedside Disposition Plan: TBD pending PT/OT recs following stroke work up [employer] called: Neuro Admission status: Admit to inpatient  Severity of Illness: The appropriate patient status for this patient is INPATIENT. Inpatient status is judged to be reasonable and necessary in order to provide the required intensity of service to ensure the patient's safety. The patient's presenting symptoms, physical exam findings, and initial radiographic and laboratory data in the context of their chronic comorbidities is felt to place them at high risk for further clinical deterioration. Furthermore, it is not anticipated that the patient will be medically stable for discharge from the hospital within 2 midnights of admission.   * I certify that at the point of admission it is my clinical judgment that the patient will require inpatient hospital care spanning beyond 2 midnights from the point of admission due to high intensity of service, high risk for further deterioration and high frequency of surveillance required.*   Clark Clowdus M. DO [facility]  How to contact the [facility] Attending or Consulting provider 7A - 7P or covering provider during after hours 7P -7A, for this patient?  Check the care team in [facility] and look for a) attending/consulting [facility] provider listed and b) the [facility] team listed Log into [email or web address]  [employer] Physician Scheduling and messaging for groups and whole hospitals  On call and physician scheduling software for group practices, residents, hospitalists and other medical providers for call, clinic, rotation and shift schedules. On[employer] is a hospital-wide system for scheduling doctors and paging doctors on call. EasyPlot is for scientific plotting and data analysis.  [email or web address]  and use [place]'s universal password to access. If you do not  have the password, please contact the hospital operator.  Locate the [facility] provider you are looking for under [facility] and page to a number that you can be directly reached. If you still have difficulty reaching the provider, please page the [facility] (Director on Call) for the Hospitalists listed on amion for assistance.  03/21/2021, 10:16 PM

## 2021-03-21 NOTE — ED Provider Triage Note (Signed)
Emergency Medicine Provider Triage Evaluation Note  Samuel Norris , a 86 y.o. male  was evaluated in triage.  Pt complains of acute onset left sidedheadache and dizziness that started this morning at 8 AM.  He reportedly began to feel off balance at that time as well.  History of hypertension and diabetes, unable to share how controlled they are.  Denies visual disturbances.  Review of Systems  Positive: Headache, nausea, vomiting, dizziness Negative:   Physical Exam  BP (!) 172/79 (BP Location: Right Arm)    Pulse (!) 56    Temp 98.5 F (36.9 C) (Oral)    Resp 16    Ht 5\' 7"  (1.702 m)    Wt 120.2 kg    SpO2 96%    BMI 41.50 kg/m  Gen:   Awake, no distress   Resp:  Normal effort  MSK:   Moves extremities without difficulty  Other:  5 out of 5 strength in bilateral upper and lower extremities.  No obvious facial droop.  NIH negative. Right eye PERRLA however left pupil does not react to light.  Medical Decision Making  Medically screening exam initiated at 4:40 PM.  Appropriate orders placed.  Archer Moist was informed that the remainder of the evaluation will be completed by another provider, this initial triage assessment does not replace that evaluation, and the importance of remaining in the ED until their evaluation is complete.    No code stroke initiated at this time. LKW 10 hours ago.  Patient is in pain.  Interview was conducted with the help of iPad interpreter, however patient responded to many questions with "may be so."  CT head and lab work in process at this time.   Chales Abrahams, PA-C 03/21/21 1647

## 2021-03-21 NOTE — ED Notes (Signed)
Pt attempted to ambulate w/ walker, pt was unsuccessful at attempt even w/ RN assistance. This RN facilitated pt transfer to wheelchair, to use the restroom. Pt safely transferred from bed to wheelchair.

## 2021-03-21 NOTE — Consult Note (Signed)
NEUROLOGY CONSULTATION NOTE   Date of service: March 21, 2021 Patient Name: Samuel Norris MRN:  [id number] DOB:  [birth year] Reason for consult: "Cerbellar stroke" Requesting Provider: Carmin Muskrat, MD _ _ _   _ __   _ __ _ _  __ __   _ __   __ _  History of Present Illness  Samuel Norris is a 86 y.o. male with PMH significant for DM2, hypertension, hyperlipidemia, smoker who present with headache, nausea, vomiting and unsteady gait. He went to bed feeling fine and woke up at 0800 with these symptoms. He had a CTH w/o contrast which was concerning for a subacute occipital stroke. He had a MRI Brain without contrast which demonstrated a left cerebellar stroke.  He endorses history of diabetes, hypertension, hyperlipidemia, quit smoking 2 days ago, has family history of strokes and reports that his dad had 4-5 strokes which eventually led to him passing away.  LKW: 2200 on 03/20/2021. mRS: 0 tNKASE: Not offered due to outside the window. Thrombectomy: Not offered due to no LVO. NIHSS components Score: Comment  1a Level of Conscious 0[x]  1[]  2[]  3[]      1b LOC Questions 0[x]  1[]  2[]       1c LOC Commands 0[x]  1[]  2[]       2 Best Gaze 0[x]  1[]  2[]       3 Visual 0[x]  1[]  2[]  3[]      4 Facial Palsy 0[x]  1[]  2[]  3[]      5a Motor Arm - left 0[x]  1[]  2[]  3[]  4[]  UN[]    5b Motor Arm - Right 0[x]  1[]  2[]  3[]  4[]  UN[]    6a Motor Leg - Left 0[x]  1[]  2[]  3[]  4[]  UN[]    6b Motor Leg - Right 0[x]  1[]  2[]  3[]  4[]  UN[]    7 Limb Ataxia 0[]  1[x]  2[]  3[]  UN[]     8 Sensory 0[x]  1[]  2[]  UN[]      9 Best Language 0[x]  1[]  2[]  3[]      10 Dysarthria 0[x]  1[]  2[]  UN[]      11 Extinct. and Inattention 0[x]  1[]  2[]       TOTAL: 1      ROS   Constitutional Denies weight loss, fever and chills.   HEENT Denies changes in vision and hearing.   Respiratory Denies SOB and cough.   CV Denies palpitations and CP   GI Denies abdominal pain, nausea, vomiting and diarrhea.   GU Denies dysuria and urinary  frequency.   MSK Denies myalgia and joint pain.   Skin Denies rash and pruritus.   Neurological Denies headache and syncope.   Psychiatric Denies recent changes in mood. Denies anxiety and depression.    Past History   Past Medical History:  Diagnosis Date   Diabetes mellitus without complication ([place])     No family history on file. Social History   Socioeconomic History   Marital status: Divorced    Spouse name: Not on file   Number of children: Not on file   Years of education: Not on file   Highest education level: Not on file  Occupational History   Not on file  Tobacco Use   Smoking status: Not on file   Smokeless tobacco: Not on file  Substance and Sexual Activity   Alcohol use: Not on file   Drug use: Not on file   Sexual activity: Not on file  Other Topics Concern   Not on file  Social History Narrative   Not on file   Social Determinants of Health  Financial Resource Strain: Not on file  Food Insecurity: Not on file  Transportation Needs: Not on file  Physical Activity: Not on file  Stress: Not on file  Social Connections: Not on file   Not on File  Medications  (Not in a hospital admission)    Vitals   Vitals:   03/21/21 1624 03/21/21 1717 03/21/21 1815 03/21/21 1830  BP: (!) 172/79 (!) 181/71 (!) 186/87 (!) 193/77  Pulse: (!) 56 (!) 55 61 (!) 55  Resp: 16 16 17 20   Temp: 98.5 F (36.9 C)     TempSrc: Oral     SpO2: 96% 93% 97% 96%  Weight:      Height:         Body mass index is 41.5 kg/m.  Physical Exam   General: Laying comfortably in bed; in no acute distress.  HENT: Normal oropharynx and mucosa. Normal external appearance of ears and nose.  Neck: Supple, no pain or tenderness  CV: No JVD. No peripheral edema.  Pulmonary: Symmetric Chest rise. Normal respiratory effort.  Abdomen: Soft to touch, non-tender.  Ext: No cyanosis, edema, or deformity  Skin: No rash. Normal palpation of skin.   Musculoskeletal: Normal digits and  nails by inspection. No clubbing.   Neurologic Examination  Mental status/Cognition: Alert, oriented to self, place, month and year, good attention.  Speech/language: Fluent, comprehension intact, object naming intact, repetition intact.  Cranial nerves:   CN II Pupils equal and reactive to light, no VF deficits    CN III,IV,VI EOM intact, no gaze preference or deviation, no nystagmus    CN V normal sensation in V1, V2, and V3 segments bilaterally    CN VII no asymmetry, no nasolabial fold flattening    CN VIII normal hearing to speech    CN IX & X normal palatal elevation, no uvular deviation    CN XI 5/5 head turn and 5/5 shoulder shrug bilaterally    CN XII midline tongue protrusion    Motor:  Muscle bulk: normal, tone normal, pronator drift none tremor none Mvmt Root Nerve  Muscle Right Left Comments  SA C5/6 Ax Deltoid 5 5   EF C5/6 Mc Biceps 5 5   EE C6/7/8 Rad Triceps 5 5   WF C6/7 Med FCR     WE C7/8 PIN ECU     F Ab C8/T1 U ADM/FDI 5 5   HF L1/2/3 Fem Illopsoas 5 5   KE L2/3/4 Fem Quad 5 5   DF L4/5 D Peron Tib Ant 5 5   PF S1/2 Tibial Grc/Sol 5 5    Reflexes:  Right Left Comments  Pectoralis      Biceps (C5/6) 2 2   Brachioradialis (C5/6) 2 2    Triceps (C6/7) 2 2    Patellar (L3/4) 2 2    Achilles (S1) 2 2    Hoffman      Plantar down down   Jaw jerk    Sensation:  Light touch Intact throughout   Pin prick    Temperature    Vibration   Proprioception    Coordination/Complex Motor:  - Finger to Nose ataxia left upper extremity. - Heel to shin with ataxia in LLE - Rapid alternating movement are slowed - Gait: deferred for patient safety given his weight and LLE ataxia.  Labs   CBC:  Recent Labs  Lab 03/21/21 1635 03/21/21 1655  WBC 10.5  --   NEUTROABS 8.6*  --   HGB 15.2  16.7  HCT 46.5 49.0  MCV 91.7  --   PLT 192  --     Basic Metabolic Panel:  Lab Results  Component Value Date   NA 135 03/21/2021   K 3.9 03/21/2021   CO2 26  03/21/2021   GLUCOSE 256 (H) 03/21/2021   BUN 11 03/21/2021   CREATININE 0.60 (L) 03/21/2021   CALCIUM 9.1 03/21/2021   GFRNONAA >60 03/21/2021   Lipid Panel: No results found for: LDLCALC HgbA1c: No results found for: HGBA1C Urine Drug Screen: No results found for: LABOPIA, COCAINSCRNUR, LABBENZ, AMPHETMU, THCU, LABBARB  Alcohol Level No results found for: [place]  CT Head without contrast(Personally reviewed): R occipital stroke, appears chronic to me.  MR Angio head without contrast and Carotid Duplex BL(Personally reviewed): L vert occlusion. Carotid duplex is still pending.  MRI Brain(Personally reviewed): L cerebellar stroke  Impression   Samuel Norris is a 86 y.o. male with PMH significant for  DM2, hypertension, hyperlipidemia, smoker who present with headache, nausea, vomiting and unsteady gait. His neurologic examination is notable for LUE and LLE ataxia.  Primary Diagnosis:  Other cerebral infarction due to occlusion of stenosis of small artery.  Secondary Diagnosis: Essential (primary) hypertension, Type 2 diabetes mellitus with hyperglycemia , and Morbid Obesity(BMI > 40)  Recommendations  Plan:  - Frequent Neuro checks per stroke unit protocol - US Carotid doppler - Recommend obtaining TTE - Recommend obtaining Lipid panel with LDL - Please start statin if LDL > 70 - Recommend HbA1c - Antithrombotic - Aspirin 81mg  daily along with plavix 75mg  daily x 21 days followed by Aspirin 81mg  daily alone. - Recommend DVT ppx - SBP goal - permissive hypertension first 24 h < 220/110. Held home meds.  - Recommend Telemetry monitoring for arrythmia - Recommend bedside swallow screen prior to PO intake. - Stroke education booklet - Recommend PT/OT/SLP consult  ______________________________________________________________________   Thank you for the opportunity to take part in the care of this patient. If you have any further questions, please contact the neurology  consultation attending.  Signed,  [place] Pager Number [phone] _ _ _   _ __   _ __ _ _  __ __   _ __   __ _

## 2021-03-22 ENCOUNTER — Encounter (HOSPITAL_COMMUNITY): Payer: Medicare Other

## 2021-03-22 ENCOUNTER — Other Ambulatory Visit: Payer: Self-pay

## 2021-03-22 ENCOUNTER — Inpatient Hospital Stay (HOSPITAL_COMMUNITY): Payer: Medicare Other

## 2021-03-22 DIAGNOSIS — I10 Essential (primary) hypertension: Secondary | ICD-10-CM | POA: Diagnosis not present

## 2021-03-22 DIAGNOSIS — I639 Cerebral infarction, unspecified: Secondary | ICD-10-CM | POA: Diagnosis not present

## 2021-03-22 DIAGNOSIS — J449 Chronic obstructive pulmonary disease, unspecified: Secondary | ICD-10-CM | POA: Diagnosis not present

## 2021-03-22 DIAGNOSIS — E785 Hyperlipidemia, unspecified: Secondary | ICD-10-CM | POA: Diagnosis not present

## 2021-03-22 DIAGNOSIS — E1159 Type 2 diabetes mellitus with other circulatory complications: Secondary | ICD-10-CM | POA: Diagnosis not present

## 2021-03-22 LAB — RESP PANEL BY RT-PCR (FLU A&B, COVID) ARPGX2
Influenza A by PCR: NEGATIVE
Influenza B by PCR: NEGATIVE
SARS Coronavirus 2 by RT PCR: NEGATIVE

## 2021-03-22 LAB — LIPID PANEL
Cholesterol: 131 mg/dL (ref 0–200)
HDL: 39 mg/dL — ABNORMAL LOW (ref >40)
LDL Cholesterol: 72 mg/dL (ref 0–99)
Total CHOL/HDL Ratio: 3.4 RATIO
Triglycerides: 99 mg/dL (ref <150)
VLDL: 20 mg/dL (ref 0–40)

## 2021-03-22 LAB — CBG MONITORING, ED
Glucose-Capillary: 173 mg/dL — ABNORMAL HIGH (ref 70–99)
Glucose-Capillary: 219 mg/dL — ABNORMAL HIGH (ref 70–99)
Glucose-Capillary: 170 mg/dL — ABNORMAL HIGH (ref 70–99)

## 2021-03-22 LAB — GLUCOSE, CAPILLARY: Glucose-Capillary: 222 mg/dL — ABNORMAL HIGH (ref 70–99)

## 2021-03-22 LAB — HEMOGLOBIN A1C
Hgb A1c MFr Bld: 8.4 % — ABNORMAL HIGH (ref 4.8–5.6)
Mean Plasma Glucose: 194.38 mg/dL

## 2021-03-22 MED ORDER — IOHEXOL 350 MG/ML SOLN
75.0000 mL | Freq: Once | INTRAVENOUS | Status: AC | PRN
  Administered 2021-03-22: 75 mL via INTRAVENOUS

## 2021-03-22 MED ORDER — ASPIRIN EC 325 MG PO TBEC
325.0000 mg | DELAYED_RELEASE_TABLET | Freq: Every day | ORAL | Status: DC
  Administered 2021-03-23 – 2021-03-25 (×3): 325 mg via ORAL
  Filled 2021-03-22 (×3): qty 1

## 2021-03-22 NOTE — ED Notes (Signed)
Dr at bedside.

## 2021-03-22 NOTE — Evaluation (Signed)
Occupational Therapy Evaluation Patient Details Name: Samuel Norris MRN: [id number] DOB: 1936/02/13 Today's Date: 03/22/2021   History of Present Illness 86 y.o. M admitted on 03/21/21 due to unsteadiness, left posterior headache and vomiting. MRI showed inferior/medial left cerebellum stroke. PMH significant for uncontrolled T2DM, HTN, HLD, morbid obesity, OSA nonadherent to CPAP, smoking.   Clinical Impression   Pt admitted for concerns listed above. PTA pt reported that he was independent with all ADL's and functional mobility. At this time, pt is very limited by dizziness and poor balance. This session, pt was not orthostatic, but was dizzy/unsteady with all OOB mobility. He is requiring increased assist for all ADL's and functional mobility, as well as the use of a RW. Recommending AIR to maximize pt's independence. OT will follow up acutely.      Recommendations for follow up therapy are one component of a multi-disciplinary discharge planning process, led by the attending physician.  Recommendations may be updated based on patient status, additional functional criteria and insurance authorization.   Follow Up Recommendations  Acute inpatient rehab (3hours/day)    Assistance Recommended at Discharge Frequent or constant Supervision/Assistance  Patient can return home with the following A little help with walking and/or transfers;A little help with bathing/dressing/bathroom;Assist for transportation;Assistance with cooking/housework;Direct supervision/assist for medications management;Direct supervision/assist for financial management    Functional Status Assessment  Patient has had a recent decline in their functional status and demonstrates the ability to make significant improvements in function in a reasonable and predictable amount of time.  Equipment Recommendations  None recommended by OT    Recommendations for Other Services Rehab consult     Precautions / Restrictions  Precautions Precautions: Fall Restrictions Weight Bearing Restrictions: No      Mobility Bed Mobility Overal bed mobility: Needs Assistance Bed Mobility: Supine to Sit;Sit to Supine     Supine to sit: Min assist Sit to supine: Min assist   General bed mobility comments: Min A for trunk elevation and BLE managment    Transfers Overall transfer level: Needs assistance Equipment used: Rolling walker (2 wheels) Transfers: Sit to/from Stand Sit to Stand: Min assist           General transfer comment: Min A to power up to stand and steady      Balance Overall balance assessment: Needs assistance Sitting-balance support: Single extremity supported Sitting balance-Leahy Scale: Fair     Standing balance support: Reliant on assistive device for balance Standing balance-Leahy Scale: Poor                             ADL either performed or assessed with clinical judgement   ADL Overall ADL's : Needs assistance/impaired Eating/Feeding: Set up;Sitting   Grooming: Set up;Sitting   Upper Body Bathing: Minimal assistance;Sitting   Lower Body Bathing: Maximal assistance;Sitting/lateral leans;Sit to/from stand   Upper Body Dressing : Min guard;Sitting   Lower Body Dressing: Maximal assistance;Sitting/lateral leans;Sit to/from stand   Toilet Transfer: Minimal assistance;Stand-pivot   Toileting- Clothing Manipulation and Hygiene: Moderate assistance;Sitting/lateral lean;Sit to/from stand       Functional mobility during ADLs: Minimal assistance;Rolling walker (2 wheels) General ADL Comments: PT limtied by dizziness and poor balance.     Vision Baseline Vision/History: 0 No visual deficits Ability to See in Adequate Light: 0 Adequate Patient Visual Report: Blurring of vision Vision Assessment?: Vision impaired- to be further tested in functional context Additional Comments: Pt haaving dizziness/blurring of vision with movement/standing  Perception      Praxis      Pertinent Vitals/Pain Pain Assessment: No/denies pain     Hand Dominance Right   Extremity/Trunk Assessment Upper Extremity Assessment Upper Extremity Assessment: Overall WFL for tasks assessed   Lower Extremity Assessment Lower Extremity Assessment: Defer to PT evaluation   Cervical / Trunk Assessment Cervical / Trunk Assessment: Kyphotic   Communication Communication Communication: No difficulties   Cognition Arousal/Alertness: Awake/alert Behavior During Therapy: WFL for tasks assessed/performed Overall Cognitive Status: Within Functional Limits for tasks assessed                                       General Comments  Pt not orthostatic BP's, BP's remain steady with final BP at 174/74    Exercises     Shoulder Instructions      Home Living Family/patient expects to be discharged to:: Private residence Living Arrangements: Spouse/significant other Available Help at Discharge: Family Type of Home: House Home Access: Stairs to enter Secretary/administrator of Steps: 2 steps Entrance Stairs-Rails: None Home Layout: Two level Alternate Level Stairs-Number of Steps: 15 stairs Alternate Level Stairs-Rails: Left Bathroom Shower/Tub: Chief Strategy Officer: Standard     Home Equipment: Agricultural consultant (2 wheels);Shower seat          Prior Functioning/Environment Prior Level of Function : Independent/Modified Independent             Mobility Comments: uses a RW sometimes ADLs Comments: indep        OT Problem List: Decreased strength;Decreased activity tolerance;Impaired balance (sitting and/or standing);Impaired vision/perception;Decreased safety awareness;Decreased knowledge of use of DME or AE;Obesity      OT Treatment/Interventions: Self-care/ADL training;Therapeutic exercise;Energy conservation;DME and/or AE instruction;Therapeutic activities;Patient/family education;Balance training    OT Goals(Current  goals can be found in the care plan section) Acute Rehab OT Goals Patient Stated Goal: to go home OT Goal Formulation: With patient Time For Goal Achievement: 04/05/21 Potential to Achieve Goals: Good ADL Goals Pt Will Perform Grooming: with modified independence;standing Pt Will Perform Lower Body Bathing: with modified independence;sit to/from stand;sitting/lateral leans Pt Will Perform Lower Body Dressing: with modified independence;sitting/lateral leans;sit to/from stand Pt Will Transfer to Toilet: with modified independence;ambulating Pt Will Perform Toileting - Clothing Manipulation and hygiene: with modified independence;sitting/lateral leans;sit to/from stand  OT Frequency: Min 2X/week    Co-evaluation              AM-PAC OT "6 Clicks" Daily Activity     Outcome Measure Help from another person eating meals?: A Little Help from another person taking care of personal grooming?: A Little Help from another person toileting, which includes using toliet, bedpan, or urinal?: A Lot Help from another person bathing (including washing, rinsing, drying)?: A Lot Help from another person to put on and taking off regular upper body clothing?: A Little Help from another person to put on and taking off regular lower body clothing?: A Lot 6 Click Score: 15   End of Session Equipment Utilized During Treatment: Gait belt;Rolling walker (2 wheels) Nurse Communication: Mobility status  Activity Tolerance: Patient tolerated treatment well Patient left: in bed;with call bell/phone within reach;with family/visitor present  OT Visit Diagnosis: Unsteadiness on feet (R26.81);Other abnormalities of gait and mobility (R26.89);Muscle weakness (generalized) (M62.81)                Time: 4360-6770 OT Time Calculation (min): 28 min  Charges:  OT General Charges $OT Visit: 1 Visit OT Evaluation $OT Eval Moderate Complexity: 1 Mod OT Treatments $Therapeutic Activity: 8-22 mins  Terina Mcelhinny H.,  OTR/L Acute Rehabilitation  Kammy Klett Elane Sidney Silberman 03/22/2021, 8:06 PM

## 2021-03-22 NOTE — Progress Notes (Addendum)
STROKE TEAM PROGRESS NOTE   ATTENDING NOTE: I reviewed above note and agree with the assessment and plan. Pt was seen and examined.   86 year old male with history of diabetes, hypertension, hyperlipidemia, smoker, morbid obesity admitted for headache, nausea vomiting and unsteady gait.  CT head showed right occipital chronic infarct.  MRI showed acute left cerebellum PICA infarct with chronic right MCA/PCA infarct.  MRA showed left V4 high-grade stenosis.  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  2D echo pending.  A1c 8.4, LDL 72, creatinine 0.60.    On exam, patient obese, chronic shortness of breath, left FTN and HTS ataxic.  Other than that, neurologically intact.  Etiology for patient stroke likely due to left VA occlusion, large vessel disease.  Recommend aspirin 325 and Plavix 75 DAPT for 3 months and then Plavix alone.  Continue Lipitor 80.  Extensive education on smoking cessation.  Aggressive risk factor modification including better DM control and weight loss.  For detailed assessment and plan, please refer to above as I have made changes wherever appropriate.   Neurology will sign off. Please call with questions. Pt will follow up with stroke clinic NP at [facility] in about 4 weeks. Thanks for the consult.   Marvel Plan, MD PhD Stroke Neurology 03/22/2021 6:08 PM    INTERVAL HISTORY His daughter is at the bedside.  Patient was seen and assessed with virtual Spanish interpreter. Patient reports to having sudden nausea and vomiting when going to bed the night before then waking up feeling like a "ragdoll" with significant left sided weakness. Patient reports noncompliance with diabetes medication due to side effects reportedly hallucinations, nausea, and vomiting. Patient reports compliance with all other prescribed medications. MRI showed small acute infarct of inferior medial left cerebellar hemisphere. MRA showed diminished flow in left vertebral  artery V4 segment. Head/Neck CT angio showed occluded Left Vertebral Artery just beyond origin, only partial reconstituted V4 segment, and mild to moderate right PCA P2 segment stenosis. LDL cholesterol 72, A1c 8.4  Extensively discussed with patient the need for better control of diabetes and adjustments to medication to ensure compliance. Discussed we would be adding Plavix and Aspirin for 3 months and then continuing Plavix indefinitely. Patient agreeable to plan and had no other questions at this time.  Vitals:   03/22/21 0305 03/22/21 0400 03/22/21 0500 03/22/21 0600  BP: 129/60 (!) 133/59 (!) 171/61 (!) 129/57  Pulse: 73 60 (!) 55 (!) 51  Resp: 17 18 20 19   Temp:      TempSrc:      SpO2: 94% 96% 94% 93%  Weight:      Height:       CBC:  Recent Labs  Lab 03/21/21 1635 03/21/21 1655  WBC 10.5  --   NEUTROABS 8.6*  --   HGB 15.2 16.7  HCT 46.5 49.0  MCV 91.7  --   PLT 192  --    Basic Metabolic Panel:  Recent Labs  Lab 03/21/21 1635 03/21/21 1655  NA 135 135  K 4.0 3.9  CL 99 98  CO2 26  --   GLUCOSE 254* 256*  BUN 9 11  CREATININE 0.78 0.60*  CALCIUM 9.1  --    Lipid Panel:  Recent Labs  Lab 03/22/21 0310  CHOL 131  TRIG 99  HDL 39*  CHOLHDL 3.4  VLDL 20  LDLCALC 72   HgbA1c:  Recent Labs  Lab 03/22/21 0310  HGBA1C 8.4*  Urine Drug Screen: No results for input(s): LABOPIA, COCAINSCRNUR, LABBENZ, AMPHETMU, THCU, LABBARB in the last 168 hours.  Alcohol Level No results for input(s): ETH in the last 168 hours.  IMAGING past 24 hours CT HEAD WO CONTRAST  Result Date: 03/21/2021 CLINICAL DATA:  Neuro deficit, acute, stroke suspected Patient reports sudden onset of headache and dizziness. EXAM: CT HEAD WITHOUT CONTRAST TECHNIQUE: Contiguous axial images were obtained from the base of the skull through the vertex without intravenous contrast. COMPARISON:  None. FINDINGS: Brain: Focal area of low-density and loss of gray-white differentiation in the right  occipital lobe, series 3, image 18 and series 5, image 49. No acute intracranial hemorrhage. Generalized atrophy, normal for age. Mild periventricular white matter hypodensity typical of chronic small vessel ischemia. No subdural or extra-axial collection. No hydrocephalus. Basilar cisterns are patent. Vascular: No hyperdense vessel or unexpected calcification. Skull: No fracture or focal lesion. Sinuses/Orbits: Trace mucosal thickening of maxillary sinuses. No sinus fluid levels. No mastoid effusion. Bilateral cataract resection. Other: Small left frontal scalp lipoma. IMPRESSION: 1. Focal area of low-density and loss of gray-white differentiation in the right occipital lobe, suspicious for infarct, likely subacute. Consider further evaluation with MRI. 2. Age related atrophy and chronic small vessel ischemia. Electronically Signed   By: Narda RutherfordMelanie  Sanford M.D.   On: 03/21/2021 17:19   MR ANGIO HEAD WO CONTRAST  Result Date: 03/21/2021 CLINICAL DATA:  Acute neurologic deficit EXAM: MRI HEAD WITHOUT CONTRAST MRA HEAD WITHOUT CONTRAST TECHNIQUE: Multiplanar, multi-echo pulse sequences of the brain and surrounding structures were acquired without intravenous contrast. Angiographic images of the Circle of Willis were acquired using MRA technique without intravenous contrast. COMPARISON:  No pertinent prior exam. FINDINGS: MRI HEAD FINDINGS Brain: Small area of acute ischemia involving the inferior medial left cerebellar hemisphere. No acute or chronic hemorrhage. Normal white matter signal. Generalized volume loss without a clear lobar predilection. The midline structures are normal. Skull and upper cervical spine: Normal calvarium and skull base. Visualized upper cervical spine and soft tissues are normal. Sinuses/Orbits:No paranasal sinus fluid levels or advanced mucosal thickening. No mastoid or middle ear effusion. Normal orbits. MRA HEAD FINDINGS POSTERIOR CIRCULATION: --Vertebral arteries: Diminished flow  related enhancement of the left vertebral artery V4 segment. Normal right V4. --Inferior cerebellar arteries: Normal. --Basilar artery: Normal. --Superior cerebellar arteries: Normal. --Posterior cerebral arteries: Normal. ANTERIOR CIRCULATION: --Intracranial internal carotid arteries: Normal. --Anterior cerebral arteries (ACA): Normal. --Middle cerebral arteries (MCA): Normal. ANATOMIC VARIANTS: None IMPRESSION: 1. Small acute infarct of the inferior medial left cerebellar hemisphere. No hemorrhage or mass effect. 2. Diminished flow related enhancement of the left vertebral artery V4 segment, consistent with high-grade stenosis. 3. Otherwise normal intracranial MRA. Electronically Signed   By: Deatra RobinsonKevin  Herman M.D.   On: 03/21/2021 21:26   MR BRAIN WO CONTRAST  Result Date: 03/21/2021 CLINICAL DATA:  Acute neurologic deficit EXAM: MRI HEAD WITHOUT CONTRAST MRA HEAD WITHOUT CONTRAST TECHNIQUE: Multiplanar, multi-echo pulse sequences of the brain and surrounding structures were acquired without intravenous contrast. Angiographic images of the Circle of Willis were acquired using MRA technique without intravenous contrast. COMPARISON:  No pertinent prior exam. FINDINGS: MRI HEAD FINDINGS Brain: Small area of acute ischemia involving the inferior medial left cerebellar hemisphere. No acute or chronic hemorrhage. Normal white matter signal. Generalized volume loss without a clear lobar predilection. The midline structures are normal. Skull and upper cervical spine: Normal calvarium and skull base. Visualized upper cervical spine and soft tissues are normal. Sinuses/Orbits:No paranasal sinus fluid levels  or advanced mucosal thickening. No mastoid or middle ear effusion. Normal orbits. MRA HEAD FINDINGS POSTERIOR CIRCULATION: --Vertebral arteries: Diminished flow related enhancement of the left vertebral artery V4 segment. Normal right V4. --Inferior cerebellar arteries: Normal. --Basilar artery: Normal. --Superior  cerebellar arteries: Normal. --Posterior cerebral arteries: Normal. ANTERIOR CIRCULATION: --Intracranial internal carotid arteries: Normal. --Anterior cerebral arteries (ACA): Normal. --Middle cerebral arteries (MCA): Normal. ANATOMIC VARIANTS: None IMPRESSION: 1. Small acute infarct of the inferior medial left cerebellar hemisphere. No hemorrhage or mass effect. 2. Diminished flow related enhancement of the left vertebral artery V4 segment, consistent with high-grade stenosis. 3. Otherwise normal intracranial MRA. Electronically Signed   By: Deatra RobinsonKevin  Herman M.D.   On: 03/21/2021 21:26    PHYSICAL EXAM  Temp:  [98.5 F (36.9 C)] 98.5 F (36.9 C) (01/07 1624) Pulse Rate:  [51-73] 51 (01/08 0600) Resp:  [16-23] 19 (01/08 0600) BP: (129-193)/(57-94) 129/57 (01/08 0600) SpO2:  [93 %-97 %] 93 % (01/08 0600) Weight:  [120.2 kg] 120.2 kg (01/07 1624)  General - Obese male, appears fatigued.  Cardiovascular - Regular rhythm and rate.  Mental Status -  Level of arousal and orientation to time, place, and person were intact. Language including expression, naming, repetition, comprehension was assessed and found intact. Attention span and concentration were normal. Recent and remote memory were intact. Fund of Knowledge was assessed and was intact.  Cranial Nerves II - XII - II - Visual field intact OU. III, IV, VI - Extraocular movements intact. V - Facial sensation intact bilaterally. VII - Facial movement intact bilaterally. VIII - Hearing & vestibular intact bilaterally. X - Palate elevates symmetrically. XI - Chin turning & shoulder shrug intact bilaterally. XII - Tongue protrusion intact.  Motor Strength - The patients strength was normal in all extremities. Mild left pronator drift. Patient short of breath and  extremely fatigued after raising arms for 10 seconds.  Bulk was normal and fasciculations were absent.   Motor Tone - Muscle tone was assessed at the neck and appendages and was  normal.  Reflexes - The patients reflexes were symmetrical in all extremities and he had no pathological reflexes.  Sensory - Light touch, temperature/pinprick were assessed and were symmetrical.    Coordination - Finger to nose ataxia LUE, heal to shin ataxia LLE  Gait and Station - deferred.  ASSESSMENT/PLAN Mr. Samuel AbrahamsFernando Norris is a 86 y.o. male with history of T2DM, HTN, HLD, obesity, OSA non-adherent to CPAP, smoking (quit 3 days ago) presenting with HA, n/v, and unsteady gait.   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 Age related Atrophy and chronic small vessel ischemia CT head/neck angio occluded Left Vertebral Artery just beyond origin, only partial reconstituted V4 segment, and mild to moderate right PCA P2 segment stenosis MRI  small acute infarct of inferior medial left cerebellar hemisphere MRA  diminished flow related enhancement of left vertebral artery V4 segment consistent with high grade stenosis 2D Echo pending LDL 72 HgbA1c 8.4 VTE prophylaxis - Lovenox aspirin 81 mg daily prior to admission, now on aspirin 325 mg daily and clopidogrel 75 mg daily. Will need dual antiplatelet for 3 months and then plavix alone.  Therapy recommendations:  pending Disposition:  pending  Hypertension Home meds:  Cozaar, Lasix Permissive hypertension (OK if < 220/120) but gradually normalize in 5-7 days Long-term BP goal normotensive  Hyperlipidemia Home meds:  Lipitor, resumed in hospital LDL 72,  goal < 70  Continue statin at discharge  Diabetes type II Uncontrolled Home meds:  metformin (noncompliant) HgbA1c 8.4, goal < 7.0 CBGs SSI  Other Stroke Risk Factors Advanced Age >/= 38  Cigarette smoker (until 3 days ago). Advised to continue to abstain from smoking Obesity, Body mass index is 41.5 kg/m., BMI >/= 30 associated with increased stroke risk, recommend  weight loss, diet and exercise as appropriate  Hx stroke/TIA Coronary artery disease OSA nonadherent to CPAP  Other Active Problems Hypothyroidism: continue synthroid COPD: continue home nebs  Hospital day # 1  Park Pope, MD PGY1 Resident  To contact Stroke Continuity provider, please refer to [email or web address]. After hours, contact General Neurology

## 2021-03-22 NOTE — Progress Notes (Signed)
PROGRESS NOTE  Samuel Norris  BTD:[id number] DOB: [birth year] DOA: 03/21/2021 PCP: System, Provider Not In   Brief Narrative: Samuel Norris is an 86 y.o. 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 He was admitted for stroke evaluation and work up.    Assessment & Plan: Principal Problem:   Acute ischemic stroke (HCC) Active Problems:   DM2 (diabetes mellitus, type 2) (HCC)   HTN (hypertension)   HLD (hyperlipidemia)   OSA (obstructive sleep apnea)   Hypothyroidism   Smoking   COPD (chronic obstructive pulmonary disease) (HCC)  Acute left cerebellar stroke: Likely related to V4 vertebral artery occlusion. - DAPT x3 months followed by monotherapy with plavix currently recommended per my discussion with neurology (formal evaluation and further work up are pending).  - LDL 72 > continue high intensity statin - PT/OT evaluation pending though patient required wheelchair to get OOB in the ED earlier today. May not be able to return home at this time.  - Echocardiogram pending  Uncontrolled T2DM, HLD: HbA1c 8.4% due to nonadherence to medications as outpatient.  - SSI while here. Plan to restart metformin, sitagliptin, statin at discharge. - Discussed at length the need to better control this risk factor.   HTN:  - Restart losartan once outside window of permissive HTN.   Morbid obesity:  - BMI 41. Also discussed lifestyle modifications to reduce impact of this risk factor.  OSA:  - Recommend CPAP at discharge, though pt has not used this.  Tobacco use, COPD:  - Congratulated on quitting 3 days ago. Reach out to PCP if needed for further assistance in maintaining abstinence.  - Continue symbicort, formulary.    Hypothyroidism:  - Continue synthroid  GERD:  - PPI  DVT prophylaxis: Lovenox Code Status: Full Family Communication: None at bedside Disposition Plan:  Status is: Inpatient  Remains inpatient appropriate because: Further stroke work up, formal stroke neurology evaluation, and therapy evaluations needed  Consultants:  Neurology, Dr. Roda Shutters  Procedures:  Echo pending  Antimicrobials: None   Subjective: Samuel Norris, Spanish video interpretor [phone], was used throughout encounter. Pt reports stable feeling of off balance, felt very wobbly and needed wheelchair earlier this morning. Needs to have BM now. Headache is improving. He says he has up until recently been the healthiest person he knows (?).   Objective: Vitals:   03/22/21 1028 03/22/21 1213 03/22/21 1215 03/22/21 1315  BP: (!) 158/83 (!) 150/88 (!) 150/88 (!) 174/74  Pulse: (!) 57 60 61 63  Resp: 19 18 20  (!) 21  Temp:      TempSrc:      SpO2: 93% 95% 97% 90%  Weight:      Height:       No intake or output data in the 24 hours ending 03/22/21 1336 Filed Weights   03/21/21 1624  Weight: 120.2 kg    Gen: Chronically ill-appearing elderly obese male in no distress  Pulm: Non-labored breathing room air, diminished. Clear to auscultation bilaterally.  CV: Regular rate and rhythm. No murmur, rub, or gallop. No JVD, no pitting pedal edema. GI: Abdomen soft, non-tender, non-distended, with normoactive bowel sounds. No organomegaly or masses felt. Ext: Warm, no deformities Skin: No rashes, lesions or ulcers on visualized skin Neuro: Alert and oriented. Ataxic LUE,  LLE, mild dysmetria, dysdiadochokinesia bilaterally. No other focal neurological deficits. Psych: Judgement and insight appear normal. Mood & affect appropriate.   Data Reviewed: I have personally reviewed following labs and imaging studies  CBC: Recent Labs  Lab 03/21/21 1635 03/21/21 1655  WBC 10.5  --   NEUTROABS 8.6*  --   HGB 15.2 16.7  HCT 46.5  49.0  MCV 91.7  --   PLT 192  --    Basic Metabolic Panel: Recent Labs  Lab 03/21/21 1635 03/21/21 1655  NA 135 135  K 4.0 3.9  CL 99 98  CO2 26  --   GLUCOSE 254* 256*  BUN 9 11  CREATININE 0.78 0.60*  CALCIUM 9.1  --    GFR: Estimated Creatinine Clearance: 86.8 mL/min (A) (by C-G formula based on SCr of 0.6 mg/dL (L)). Liver Function Tests: Recent Labs  Lab 03/21/21 1635  AST 46*  ALT 63*  ALKPHOS 89  BILITOT 0.8  PROT 7.1  ALBUMIN 3.4*   No results for input(s): LIPASE, AMYLASE in the last 168 hours. No results for input(s): AMMONIA in the last 168 hours. Coagulation Profile: Recent Labs  Lab 03/21/21 1635  INR 1.0   Cardiac Enzymes: No results for input(s): CKTOTAL, CKMB, CKMBINDEX, TROPONINI in the last 168 hours. BNP (last 3 results) No results for input(s): PROBNP in the last 8760 hours. HbA1C: Recent Labs    03/22/21 0310  HGBA1C 8.4*   CBG: Recent Labs  Lab 03/21/21 2256 03/22/21 0812  GLUCAP 201* 173*   Lipid Profile: Recent Labs    03/22/21 0310  CHOL 131  HDL 39*  LDLCALC 72  TRIG 99  CHOLHDL 3.4   Thyroid Function Tests: No results for input(s): TSH, T4TOTAL, FREET4, T3FREE, THYROIDAB in the last 72 hours. Anemia Panel: No results for input(s): VITAMINB12, FOLATE, FERRITIN, TIBC, IRON, RETICCTPCT in the last 72 hours. Urine analysis: No results found for: COLORURINE, APPEARANCEUR, LABSPEC, PHURINE, GLUCOSEU, HGBUR, BILIRUBINUR, KETONESUR, PROTEINUR, UROBILINOGEN, NITRITE, LEUKOCYTESUR Recent Results (from the past 240 hour(s))  Resp Panel by RT-PCR (Flu A&B, Covid) Nasopharyngeal Swab     Status: None   Collection Time: 03/22/21  5:39 AM   Specimen: Nasopharyngeal Swab; Nasopharyngeal(NP) swabs in vial transport medium  Result Value Ref Range Status   SARS Coronavirus 2 by RT PCR NEGATIVE NEGATIVE Final    Comment: (NOTE) SARS-CoV-2 target nucleic acids are NOT DETECTED.  The SARS-CoV-2 RNA is generally detectable in upper  respiratory specimens during the acute phase of infection. The lowest concentration of SARS-CoV-2 viral copies this assay can detect is 138 copies/mL. A negative result does not preclude SARS-Cov-2 infection and should not be used as the sole basis for treatment or other patient management decisions. A negative result may occur with  improper specimen collection/handling, submission of specimen other than nasopharyngeal swab, presence of viral mutation(s) within the areas targeted by this assay, and inadequate number of viral copies(<138 copies/mL). A negative result must be combined with clinical observations, patient history, and epidemiological information. The expected result is Negative.  Fact Sheet for Patients:  EntrepreneurPulse.com.au  Fact Sheet for Healthcare Providers:  IncredibleEmployment.be  This test is no t yet approved or cleared by the [place] FDA and  has been authorized for detection and/or diagnosis of SARS-CoV-2 by FDA under an Emergency Use Authorization (EUA). This EUA will remain  in effect (meaning this test can be used) for the duration of the COVID-19 declaration under Section 564(b)(1) of the Act, 21 U.S.C.section  360bbb-3(b)(1), unless the authorization is terminated  or revoked sooner.       Influenza A by PCR NEGATIVE NEGATIVE Final   Influenza B by PCR NEGATIVE NEGATIVE Final    Comment: (NOTE) The Xpert Xpress SARS-CoV-2/FLU/RSV plus assay is intended as an aid in the diagnosis of influenza from Nasopharyngeal swab specimens and should not be used as a sole basis for treatment. Nasal washings and aspirates are unacceptable for Xpert Xpress SARS-CoV-2/FLU/RSV testing.  Fact Sheet for Patients: EntrepreneurPulse.com.au  Fact Sheet for Healthcare Providers: IncredibleEmployment.be  This test is not yet approved or cleared by the [place] FDA and has been  authorized for detection and/or diagnosis of SARS-CoV-2 by FDA under an Emergency Use Authorization (EUA). This EUA will remain in effect (meaning this test can be used) for the duration of the COVID-19 declaration under Section 564(b)(1) of the Act, 21 U.S.C. section 360bbb-3(b)(1), unless the authorization is terminated or revoked.  Performed at [facility], [place] [address], [place], [place] [address]       Radiology Studies: CT ANGIO HEAD NECK W WO CM  Result Date: 03/22/2021 CLINICAL DATA:  86 year old male with neurologic deficit. Acute left cerebellar infarct, abnormal distal left vertebral artery. EXAM: CT ANGIOGRAPHY HEAD AND NECK TECHNIQUE: Multidetector CT imaging of the head and neck was performed using the standard protocol during bolus administration of intravenous contrast. Multiplanar CT image reconstructions and MIPs were obtained to evaluate the vascular anatomy. Carotid stenosis measurements (when applicable) are obtained utilizing NASCET criteria, using the distal internal carotid diameter as the denominator. CONTRAST:  49mL OMNIPAQUE IOHEXOL 350 MG/ML SOLN COMPARISON:  Brain MRI and intracranial MRA yesterday. Head CT yesterday. FINDINGS: CT HEAD Brain: Cytotoxic edema now visible in the medial left cerebellum. No associated hemorrhage. No posterior fossa mass effect. Superimposed small chronic left cerebellar infarct. Chronic encephalomalacia medial right PCA territory. Stable gray-white matter differentiation elsewhere. No midline shift, ventriculomegaly, mass effect, evidence of mass lesion, intracranial hemorrhage or new cortically based acute infarction. Calvarium and skull base: Negative. Paranasal sinuses: Visualized paranasal sinuses and mastoids are stable and well aerated. Orbits: No acute orbit or scalp soft tissue finding. CTA NECK Skeleton: Absent dentition. Multilevel cervical spine degeneration. No acute osseous abnormality identified. Upper chest: Superior  mediastinal lipomatosis. No superior mediastinal lymphadenopathy. Mild mosaic attenuation in the upper lungs likely mild atelectasis and gas trapping. Atelectatic changes to the trachea at the thoracic inlet. Other neck: Retropharyngeal course of both carotid arteries (normal variant). No acute neck soft tissue finding. Aortic arch: 3 vessel arch configuration. Mild calcified arch atherosclerosis. Right carotid system: Tortuous brachiocephalic artery and proximal right CCA without plaque or stenosis. Retropharyngeal course of the right carotid. Minor calcified plaque at the posterior right ICA origin without stenosis. Tortuous right ICA distal to the bulb. Left carotid system: Tortuous proximal left CCA and retropharyngeal course without stenosis. Negative left carotid bifurcation. No stenosis. Vertebral arteries: Calcified plaque and tortuosity in the proximal right subclavian artery without hemodynamically significant stenosis. Normal right vertebral artery origin. Right vertebral artery is patent and normal to the skull base. Proximal left subclavian artery is patent with mild ectasia and minimal atherosclerosis. Left vertebral artery is occluded just distal to its origin in the V1 segment (series 18, image 534). No left vertebral artery reconstitution to the skull base. CTA HEAD Posterior circulation: Right vertebral V4 segment is patent to the basilar without stenosis. Right AICA appears dominant. There is partial retrograde reconstitution of the left vertebral V4 segment  and the left PICA origin remains patent. Patent basilar artery without stenosis. AICA, SCA and PCA origins are patent. Posterior communicating arteries are diminutive or absent. Bilateral PCA branches are patent, but there is a short segment mild to moderate stenosis of the right P2 on series 14, image 27. Anterior circulation: Both ICA siphons are patent and mildly tortuous. Minimal siphon plaque. No ICA siphon stenosis. Patent carotid  termini, MCA and ACA origins with mild tortuosity. Diminutive or absent anterior communicating artery. Bilateral MCA and ACA branches are patent with mild tortuosity and no significant stenosis. Venous sinuses: Patent. Anatomic variants: None. Review of the MIP images confirms the above findings IMPRESSION: 1. Positive for occluded Left Vertebral Artery just beyond its origin. Only partial reconstituted V4 segment but the left PICA origin appears to remain patent. 2. Mild to moderate right PCA P2 segment stenosis. But no other significant arterial stenosis in the head or neck. Generalized arterial tortuosity. Retrograde course of the carotids. 3. Expected CT appearance of the Left cerebellar infarct. No associated hemorrhage or mass effect. 4. No new intracranial abnormality. These results were communicated to Dr. Erlinda Hong at 10:31 am on 03/22/2021 by text page via the [facility] messaging system. Electronically Signed   By: Genevie Ann M.D.   On: 03/22/2021 10:35   CT HEAD WO CONTRAST  Result Date: 03/21/2021 CLINICAL DATA:  Neuro deficit, acute, stroke suspected Patient reports sudden onset of headache and dizziness. EXAM: CT HEAD WITHOUT CONTRAST TECHNIQUE: Contiguous axial images were obtained from the base of the skull through the vertex without intravenous contrast. COMPARISON:  None. FINDINGS: Brain: Focal area of low-density and loss of gray-white differentiation in the right occipital lobe, series 3, image 18 and series 5, image 49. No acute intracranial hemorrhage. Generalized atrophy, normal for age. Mild periventricular white matter hypodensity typical of chronic small vessel ischemia. No subdural or extra-axial collection. No hydrocephalus. Basilar cisterns are patent. Vascular: No hyperdense vessel or unexpected calcification. Skull: No fracture or focal lesion. Sinuses/Orbits: Trace mucosal thickening of maxillary sinuses. No sinus fluid levels. No mastoid effusion. Bilateral cataract resection. Other: Small left  frontal scalp lipoma. IMPRESSION: 1. Focal area of low-density and loss of gray-white differentiation in the right occipital lobe, suspicious for infarct, likely subacute. Consider further evaluation with MRI. 2. Age related atrophy and chronic small vessel ischemia. Electronically Signed   By: Keith Rake M.D.   On: 03/21/2021 17:19   MR ANGIO HEAD WO CONTRAST  Result Date: 03/21/2021 CLINICAL DATA:  Acute neurologic deficit EXAM: MRI HEAD WITHOUT CONTRAST MRA HEAD WITHOUT CONTRAST TECHNIQUE: Multiplanar, multi-echo pulse sequences of the brain and surrounding structures were acquired without intravenous contrast. Angiographic images of the Circle of Willis were acquired using MRA technique without intravenous contrast. COMPARISON:  No pertinent prior exam. FINDINGS: MRI HEAD FINDINGS Brain: Small area of acute ischemia involving the inferior medial left cerebellar hemisphere. No acute or chronic hemorrhage. Normal white matter signal. Generalized volume loss without a clear lobar predilection. The midline structures are normal. Skull and upper cervical spine: Normal calvarium and skull base. Visualized upper cervical spine and soft tissues are normal. Sinuses/Orbits:No paranasal sinus fluid levels or advanced mucosal thickening. No mastoid or middle ear effusion. Normal orbits. MRA HEAD FINDINGS POSTERIOR CIRCULATION: --Vertebral arteries: Diminished flow related enhancement of the left vertebral artery V4 segment. Normal right V4. --Inferior cerebellar arteries: Normal. --Basilar artery: Normal. --Superior cerebellar arteries: Normal. --Posterior cerebral arteries: Normal. ANTERIOR CIRCULATION: --Intracranial internal carotid arteries: Normal. --Anterior cerebral arteries (ACA):  Normal. --Middle cerebral arteries (MCA): Normal. ANATOMIC VARIANTS: None IMPRESSION: 1. Small acute infarct of the inferior medial left cerebellar hemisphere. No hemorrhage or mass effect. 2. Diminished flow related enhancement  of the left vertebral artery V4 segment, consistent with high-grade stenosis. 3. Otherwise normal intracranial MRA. Electronically Signed   By: Ulyses Jarred M.D.   On: 03/21/2021 21:26   MR BRAIN WO CONTRAST  Result Date: 03/21/2021 CLINICAL DATA:  Acute neurologic deficit EXAM: MRI HEAD WITHOUT CONTRAST MRA HEAD WITHOUT CONTRAST TECHNIQUE: Multiplanar, multi-echo pulse sequences of the brain and surrounding structures were acquired without intravenous contrast. Angiographic images of the Circle of Willis were acquired using MRA technique without intravenous contrast. COMPARISON:  No pertinent prior exam. FINDINGS: MRI HEAD FINDINGS Brain: Small area of acute ischemia involving the inferior medial left cerebellar hemisphere. No acute or chronic hemorrhage. Normal white matter signal. Generalized volume loss without a clear lobar predilection. The midline structures are normal. Skull and upper cervical spine: Normal calvarium and skull base. Visualized upper cervical spine and soft tissues are normal. Sinuses/Orbits:No paranasal sinus fluid levels or advanced mucosal thickening. No mastoid or middle ear effusion. Normal orbits. MRA HEAD FINDINGS POSTERIOR CIRCULATION: --Vertebral arteries: Diminished flow related enhancement of the left vertebral artery V4 segment. Normal right V4. --Inferior cerebellar arteries: Normal. --Basilar artery: Normal. --Superior cerebellar arteries: Normal. --Posterior cerebral arteries: Normal. ANTERIOR CIRCULATION: --Intracranial internal carotid arteries: Normal. --Anterior cerebral arteries (ACA): Normal. --Middle cerebral arteries (MCA): Normal. ANATOMIC VARIANTS: None IMPRESSION: 1. Small acute infarct of the inferior medial left cerebellar hemisphere. No hemorrhage or mass effect. 2. Diminished flow related enhancement of the left vertebral artery V4 segment, consistent with high-grade stenosis. 3. Otherwise normal intracranial MRA. Electronically Signed   By: Ulyses Jarred  M.D.   On: 03/21/2021 21:26    Scheduled Meds:  [START ON 03/23/2021] aspirin EC  325 mg Oral Daily   atorvastatin  80 mg Oral QHS   clopidogrel  75 mg Oral Daily   enoxaparin (LOVENOX) injection  40 mg Subcutaneous Q24H   fluorometholone  1 drop Both Eyes Daily   insulin aspart  0-15 Units Subcutaneous TID WC   insulin aspart  0-5 Units Subcutaneous QHS   levothyroxine  88 mcg Oral QAC breakfast   mometasone-formoterol  2 puff Inhalation BID   pantoprazole  40 mg Oral Daily   Continuous Infusions:   LOS: 1 day    Patrecia Pour, MD [facility] [email or web address] 03/22/2021, 1:36 PM

## 2021-03-22 NOTE — ED Notes (Signed)
Patient transported to CT 

## 2021-03-22 NOTE — ED Notes (Signed)
Blood sugar was 219.

## 2021-03-23 ENCOUNTER — Inpatient Hospital Stay (HOSPITAL_COMMUNITY): Payer: Medicare Other

## 2021-03-23 DIAGNOSIS — J449 Chronic obstructive pulmonary disease, unspecified: Secondary | ICD-10-CM | POA: Diagnosis not present

## 2021-03-23 DIAGNOSIS — I639 Cerebral infarction, unspecified: Secondary | ICD-10-CM | POA: Diagnosis not present

## 2021-03-23 DIAGNOSIS — E785 Hyperlipidemia, unspecified: Secondary | ICD-10-CM | POA: Diagnosis not present

## 2021-03-23 DIAGNOSIS — E1159 Type 2 diabetes mellitus with other circulatory complications: Secondary | ICD-10-CM | POA: Diagnosis not present

## 2021-03-23 DIAGNOSIS — I6389 Other cerebral infarction: Secondary | ICD-10-CM

## 2021-03-23 LAB — GLUCOSE, CAPILLARY
Glucose-Capillary: 156 mg/dL — ABNORMAL HIGH (ref 70–99)
Glucose-Capillary: 151 mg/dL — ABNORMAL HIGH (ref 70–99)
Glucose-Capillary: 216 mg/dL — ABNORMAL HIGH (ref 70–99)
Glucose-Capillary: 196 mg/dL — ABNORMAL HIGH (ref 70–99)

## 2021-03-23 LAB — ECHOCARDIOGRAM COMPLETE
AR max vel: 2.64 cm2
AV Peak grad: 8.8 mmHg
Ao pk vel: 1.49 m/s
Area-P 1/2: 3.34 cm2
Height: 67 in
S' Lateral: 3.33 cm
Weight: 4240 oz

## 2021-03-23 MED ORDER — GUAIFENESIN-DM 100-10 MG/5ML PO SYRP
5.0000 mL | ORAL_SOLUTION | ORAL | Status: DC | PRN
  Administered 2021-03-23: 5 mL via ORAL
  Filled 2021-03-23: qty 5

## 2021-03-23 MED ORDER — IBUPROFEN 200 MG PO TABS
600.0000 mg | ORAL_TABLET | Freq: Four times a day (QID) | ORAL | Status: DC | PRN
  Administered 2021-03-25: 600 mg via ORAL
  Filled 2021-03-23: qty 3

## 2021-03-23 MED ORDER — PHENOL 1.4 % MT LIQD
1.0000 | OROMUCOSAL | Status: DC | PRN
  Administered 2021-03-23: 1 via OROMUCOSAL
  Filled 2021-03-23: qty 177

## 2021-03-23 MED ORDER — LOSARTAN POTASSIUM 50 MG PO TABS
100.0000 mg | ORAL_TABLET | Freq: Every day | ORAL | Status: DC
  Administered 2021-03-24 – 2021-03-25 (×2): 100 mg via ORAL
  Filled 2021-03-23 (×2): qty 2

## 2021-03-23 MED ORDER — LINAGLIPTIN 5 MG PO TABS
5.0000 mg | ORAL_TABLET | Freq: Every day | ORAL | Status: DC
  Administered 2021-03-23 – 2021-03-25 (×3): 5 mg via ORAL
  Filled 2021-03-23 (×3): qty 1

## 2021-03-23 NOTE — PMR Pre-admission (Signed)
PMR Admission Coordinator Pre-Admission Assessment  Patient: Samuel Norris is an 86 y.o., male MRN: [id number] DOB: Mar 10, 1936 Height: _0  (170.2 cm)Weight: 120.2 kg  Insurance Information HMO:     PPO:      PCP:      IPA:      80/20:      OTHER:  PRIMARY: [employer] Medicare   Dual Complete   Policy#: [id number]      Subscriber: pt CM Name: [place]      Phone#: [phone] option #7     Fax#: [phone] Pre-Cert#: [id number]  approved for 7 days    Employer:  Benefits:  Phone #: [phone]     Name: 1/9 Eff. Date: 03/15/2021     Deduct: none      Out of Pocket Max: $8300      Life Max: none CIR: $1556 co pay per admission      SNF: no copay days 1 until 20; $200 co pay per days days 21 until 100 Outpatient: 80%     Co-Pay: 20% Home Health: 100%      Co-Pay: visits limited per medical neccesity DME: 80%     Co-Pay: 20% Providers: In network  SECONDARY:     Medicaid of [place]  Policy#: [id number] s active 03/23/21 [place] Counselor:       Phone#:   The Data Collection Information Summary for patients in Inpatient Rehabilitation Facilities with attached Privacy Act [place] Records was provided and verbally reviewed with: Patient  Emergency Contact Information Contact Information     Name Relation Home Work Anthony Daughter [phone]         Current Medical History  Patient Admitting Diagnosis: CVA  History of Present Illness:  86 year old right-handed male with history of hyperlipidemia, hypertension, diabetes mellitus, morbid obesity, OSA nonadherent to CPAP and quit smoking 3 years ago.   Presented 03/21/2021 with headache, nausea and vomiting as well as unsteady gait.  Cranial CT scan showed focal area of low density in loss of gray-white differentiation in the right occipital lobe suspicious for infarct likely subacute.  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  No hemorrhage or  mass-effect.  Diminished flow related enhancement of the left vertebral artery V4 segment consistent with high-grade stenosis otherwise normal intracranial MRA.  Admission chemistry unremarkable except glucose 254 AST 46 ALT 63, troponin 21, hemoglobin A1c 8.4.  Echocardiogram ejection fraction of 60 to 65% no wall motion abnormalities grade 1 diastolic dysfunction.  Currently maintained on aspirin 325 mg daily and Plavix 75 mg daily x3 months then Plavix alone.  Subcutaneous Lovenox for DVT prophylaxis.    Complete NIHSS TOTAL: 0  Patient[facility] from [facility] has been reviewed by the rehabilitation admission coordinator and physician.  Past Medical History  Past Medical History:  Diagnosis Date   Bilateral iliac artery aneurysm (HCC)    Diabetes mellitus without complication (HCC)    HTN (hypertension)    Hyperlipidemia    Hypothyroid    Obesity    OSA (obstructive sleep apnea)    Peripheral edema    Has the patient had major surgery during 100 days prior to admission? No  Family History   family history includes Gout in an other family member; Stroke in his father.  Current Medications  Current Facility-Administered Medications:    acetaminophen (TYLENOL) tablet 650 mg, 650 mg, Oral, Q4H PRN, 650 mg at 03/25/21 0655 **OR** [DISCONTINUED] acetaminophen (TYLENOL) 160 MG/5ML  solution 650 mg, 650 mg, Per Tube, Q4H PRN **OR** [DISCONTINUED] acetaminophen (TYLENOL) suppository 650 mg, 650 mg, Rectal, Q4H PRN, Alcario Drought, Jared M, DO   aspirin EC tablet 325 mg, 325 mg, Oral, Daily, France Ravens, MD, 325 mg at 03/25/21 1610   atorvastatin (LIPITOR) tablet 80 mg, 80 mg, Oral, QHS, Gardner, Jared M, DO, 80 mg at 03/24/21 2110   clopidogrel (PLAVIX) tablet 75 mg, 75 mg, Oral, Daily, Alcario Drought, Jared M, DO, 75 mg at 03/25/21 9604   cyclobenzaprine (FLEXERIL) tablet 10 mg, 10 mg, Oral, Q8H PRN, Dahal, Binaya, MD, 10 mg at 03/25/21 1232   enoxaparin (LOVENOX) injection 60 mg, 60 mg,  Subcutaneous, Q24H, Vance Gather B, MD, 60 mg at 03/25/21 0926   fentaNYL (SUBLIMAZE) injection 25 mcg, 25 mcg, Intravenous, Q2H PRN, Etta Quill, DO, 25 mcg at 03/25/21 0925   fluorometholone (FML) 0.1 % ophthalmic suspension 1 drop, 1 drop, Both Eyes, Daily, Alcario Drought, Jared M, DO, 1 drop at 03/25/21 0923   fluticasone (FLONASE) 50 MCG/ACT nasal spray 1 spray, 1 spray, Each Nare, Daily PRN, Alcario Drought, Jared M, DO   guaiFENesin-dextromethorphan (ROBITUSSIN DM) 100-10 MG/5ML syrup 5 mL, 5 mL, Oral, Q4H PRN, Etta Quill, DO, 5 mL at 03/23/21 0307   ibuprofen (ADVIL) tablet 600 mg, 600 mg, Oral, Q6H PRN, Vance Gather B, MD, 600 mg at 03/25/21 0924   insulin aspart (novoLOG) injection 0-15 Units, 0-15 Units, Subcutaneous, TID WC, Alcario Drought, Jared M, DO, 3 Units at 03/25/21 1224   insulin aspart (novoLOG) injection 0-5 Units, 0-5 Units, Subcutaneous, QHS, Gardner, Jared M, DO, 2 Units at 03/22/21 2202   levothyroxine (SYNTHROID) tablet 88 mcg, 88 mcg, Oral, QAC breakfast, Alcario Drought, Jared M, DO, 88 mcg at 03/25/21 0617   linagliptin (TRADJENTA) tablet 5 mg, 5 mg, Oral, Daily, Vance Gather B, MD, 5 mg at 03/25/21 0924   loratadine (CLARITIN) tablet 10 mg, 10 mg, Oral, Daily PRN, Alcario Drought, Jared M, DO   losartan (COZAAR) tablet 100 mg, 100 mg, Oral, Daily, Vance Gather B, MD, 100 mg at 03/25/21 5409   melatonin tablet 10 mg, 10 mg, Oral, QHS PRN, Kristopher Oppenheim, DO, 10 mg at 03/24/21 2110   mometasone-formoterol (DULERA) 100-5 MCG/ACT inhaler 2 puff, 2 puff, Inhalation, BID, Etta Quill, DO, 2 puff at 03/25/21 0922   pantoprazole (PROTONIX) EC tablet 40 mg, 40 mg, Oral, Daily, Alcario Drought, Jared M, DO, 40 mg at 03/25/21 8119   phenol (CHLORASEPTIC) mouth spray 1 spray, 1 spray, Mouth/Throat, PRN, Etta Quill, DO, 1 spray at 03/23/21 0307  Patients Current Diet:  Diet Order             Diet Carb Modified Fluid consistency: Thin; Room service appropriate? Yes  Diet effective now                   Precautions / Restrictions Precautions Precautions: Fall Restrictions Weight Bearing Restrictions: No   Has the patient had 2 or more falls or a fall with injury in the past year? No  Prior Activity Level Limited Community (1-2x/wk): Independent and driving  Prior Functional Level Self Care: Did the patient need help bathing, dressing, using the toilet or eating? Independent  Indoor Mobility: Did the patient need assistance with walking from room to room (with or without device)? Independent  Stairs: Did the patient need assistance with internal or external stairs (with or without device)? Independent  Functional Cognition: Did the patient need help planning regular tasks such as shopping or  remembering to take medications? Independent  Patient Information Do you need or want an interpreter to communicate with a doctor or health care staff?: 1. Yes  Patient's Response To:  Health Literacy and Transportation Is the patient able to respond to health literacy and transportation needs?: Yes Health Literacy - How often do you need to have someone help you when you read instructions, pamphlets, or other written material from your doctor or pharmacy?: Never In the past 12 months, has lack of transportation kept you from medical appointments or from getting medications?: No In the past 12 months, has lack of transportation kept you from meetings, work, or from getting things needed for daily living?: No  Home Assistive Devices / [place] Devices/Equipment: Environmental consultant (specify type) Home Equipment: Conservation officer, nature (2 wheels), Shower seat  Prior Device Use: Indicate devices/aids used by the patient prior to current illness, exacerbation or injury?  Uses RW as needed  Current Functional Level Cognition  Overall Cognitive Status: Within Functional Limits for tasks assessed Orientation Level: Oriented X4    Extremity Assessment (includes Sensation/Coordination)  Upper  Extremity Assessment: Defer to OT evaluation  Lower Extremity Assessment: RLE deficits/detail, LLE deficits/detail RLE Deficits / Details: strength 5/5 LLE Deficits / Details: strength 5/5    ADLs  Overall ADL's : Needs assistance/impaired Eating/Feeding: Set up, Sitting Grooming: Set up, Sitting Upper Body Bathing: Minimal assistance, Sitting Lower Body Bathing: Maximal assistance, Sitting/lateral leans, Sit to/from stand Upper Body Dressing : Min guard, Sitting Lower Body Dressing: Maximal assistance, Sitting/lateral leans, Sit to/from stand Toilet Transfer: Minimal assistance, Stand-pivot Toileting- Clothing Manipulation and Hygiene: Moderate assistance, Sitting/lateral lean, Sit to/from stand Functional mobility during ADLs: Minimal assistance, Rolling walker (2 wheels) General ADL Comments: PT limtied by dizziness and poor balance.    Mobility  Overal bed mobility: Needs Assistance Bed Mobility: Sit to Supine Supine to sit: Supervision Sit to supine: Min assist General bed mobility comments: up in recliner    Transfers  Overall transfer level: Needs assistance Equipment used: Rolling walker (2 wheels) Transfers: Sit to/from Stand Sit to Stand: Min guard General transfer comment: min guard to rise and steady, pt using momentum to power up    Ambulation / Gait / Stairs / Wheelchair Mobility  Ambulation/Gait Ambulation/Gait assistance: Herbalist (Feet): 120 Feet (& 100') Assistive device: Rolling walker (2 wheels) Gait Pattern/deviations: Step-through pattern, Decreased stride length, Drifts right/left General Gait Details: min A for balance/safety, stopped to rest and check HR and reposition heart monitor HR 67. Gait velocity: decreased Stairs: Yes Stairs assistance: Min assist Stair Management: Alternating pattern, Forwards, Two rails, Step to pattern Number of Stairs: 3 General stair comments: assist for balance/safety    Posture / Balance  Balance Overall balance assessment: Needs assistance Sitting-balance support: Feet supported Sitting balance-Leahy Scale: Good Standing balance support: Reliant on assistive device for balance Standing balance-Leahy Scale: Poor    Special needs/care consideration Hgb A1c 8.4 Smoking cessation advised Spanish Interpreter required Spanish CIR booklets provided   Previous Home Environment  Living Arrangements: Children  Lives With: Daughter Available Help at Discharge:  (daughter works 8 am until 10 pm daily) Type of Home: House Home Layout: Two level Alternate Level Stairs-Rails: Left Alternate Level Stairs-Number of Steps: 15 stairs Home Access: Stairs to enter Entrance Stairs-Rails: None Entrance Stairs-Number of Steps: 2 steps Bathroom Shower/Tub: Chiropodist: Standard Bathroom Accessibility: Yes How Accessible: Accessible via walker Home Care Services: No  Discharge Living Setting Plans for Discharge Living  Setting: Patient's home, House, Lives with (comment) (daughter) Type of Home at Discharge: House Discharge Home Layout: Two level Alternate Level Stairs-Rails: Left Alternate Level Stairs-Number of Steps: 15 Discharge Home Access: Stairs to enter Entrance Stairs-Rails: None Entrance Stairs-Number of Steps: 2 Discharge Bathroom Shower/Tub: Tub/shower unit Discharge Bathroom Toilet: Standard Discharge Bathroom Accessibility: Yes How Accessible: Accessible via walker Does the patient have any problems obtaining your medications?: No  Social/Family/Support Systems Patient Roles: Parent Contact Information: daughter, Denman George Anticipated Caregiver: daughter Anticipated Caregiver's Contact Information: see contacts Ability/Limitations of Caregiver: Yolanda works 8 am until 10 pm daily Caregiver Availability: Evenings only Discharge Plan Discussed with Primary Caregiver: Yes Is Caregiver In Agreement with Plan?: Yes Does Caregiver/Family have  Issues with Lodging/Transportation while Pt is in Rehab?: No  He is divorced for 20 years. Lives with daughter and his [place].  Goals Patient/Family Goal for Rehab: Mod I to intermittent supervision with PT and OT Expected length of stay: ELOS 7 to 10 days Cultural Considerations: Spanish interpreter needed Pt/Family Agrees to Admission and willing to participate: Yes Program Orientation Provided & Reviewed with Pt/Caregiver Including Roles  & Responsibilities: Yes  Decrease burden of Care through IP rehab admission: n/a  Possible need for SNF placement upon discharge: not anticipated  Patient Condition: I have reviewed medical records from [facility], spoken with CM, and patient. I met with patient at the bedside for inpatient rehabilitation assessment.  Patient will benefit from ongoing PT and OT, can actively participate in 3 hours of therapy a day 5 days of the week, and can make measurable gains during the admission.  Patient will also benefit from the coordinated team approach during an Inpatient Acute Rehabilitation admission.  The patient will receive intensive therapy as well as Rehabilitation physician, nursing, social worker, and care management interventions.  Due to bladder management, bowel management, safety, skin/wound care, disease management, medication administration, pain management, and patient education the patient requires 24 hour a day rehabilitation nursing.  The patient is currently min assist overall with mobility and basic ADLs.  Discharge setting and therapy post discharge at home with home health is anticipated.  Patient has agreed to participate in the Acute Inpatient Rehabilitation Program and will admit today.  Preadmission Screen Completed By:  Cleatrice Burke, 03/25/2021 3:15 PM ______________________________________________________________________   Discussed status with Dr. Ranell Patrick on 03/25/2021 at 1516 and received approval for admission  today.  Admission Coordinator:  Cleatrice Burke, RN, time  6433 Date  03/25/2021   Assessment/Plan: Diagnosis: CVA Does the need for close, 24 hr/day Medical supervision in concert with the patient's rehab needs make it unreasonable for this patient to be served in a less intensive setting? Yes Co-Morbidities requiring supervision/potential complications: morbid obesity, DM2, HTN, HLD, OSA Due to bladder management, bowel management, safety, skin/wound care, disease management, medication administration, pain management, and patient education, does the patient require 24 hr/day rehab nursing? Yes Does the patient require coordinated care of a physician, rehab nurse, PT, OT, and SLP to address physical and functional deficits in the context of the above medical diagnosis(es)? Yes Addressing deficits in the following areas: balance, endurance, locomotion, strength, transferring, bowel/bladder control, bathing, dressing, feeding, grooming, toileting, and psychosocial support Can the patient actively participate in an intensive therapy program of at least 3 hrs of therapy 5 days a week? Yes The potential for patient to make measurable gains while on inpatient rehab is excellent Anticipated functional outcomes upon discharge from inpatient rehab: modified independent PT, modified  independent OT, independent SLP Estimated rehab length of stay to reach the above functional goals is: 10-14 days Anticipated discharge destination: Home 10. Overall Rehab/Functional Prognosis: excellent   MD Signature: Leeroy Cha, MD

## 2021-03-23 NOTE — Progress Notes (Signed)
°  Transition of Care [facility]) Screening Note   Patient Details  Name: Samuel Norris Date of Birth: February 11, 1936   Transition of Care [facility]) CM/SW Contact:    Baldemar Lenis, LCSW Phone Number: 03/23/2021, 3:04 PM    Transition of Care Department [facility]) has reviewed patient; pending insurance authorization for inpatient rehab at this time. We will continue to monitor patient advancement through interdisciplinary progression rounds. If new patient transition needs arise, please place a TOC consult.

## 2021-03-23 NOTE — Progress Notes (Incomplete)
Echocardiogram 2D Echocardiogram has been performed.  Samuel Norris 03/23/2021, 9:22 AM

## 2021-03-23 NOTE — Evaluation (Signed)
Physical Therapy Evaluation Patient Details Name: Samuel Norris MRN: [id number] DOB: 28-Aug-1935 Today's Date: 03/23/2021  History of Present Illness  86 y.o. M admitted on 03/21/21 due to unsteadiness, left posterior headache and vomiting. MRI showed inferior/medial left cerebellum stroke. PMH significant for uncontrolled T2DM, HTN, HLD, morbid obesity, OSA nonadherent to CPAP, smoking.  Clinical Impression  PTA, pt lives with his spouse and is independent. Pt reports continued dizziness with transitional movements; encouraged gaze stabilization. Did not appear to have ataxia in any extremity. Pt ambulating 25 feet with a walker at a min assist level. Presents with impaired standing balance and decreased activity tolerance. Would benefit from post acute rehab to address deficits and maximize functional independence.  [employer] interpreter [place] #[id number] assisted with this session     Recommendations for follow up therapy are one component of a multi-disciplinary discharge planning process, led by the attending physician.  Recommendations may be updated based on patient status, additional functional criteria and insurance authorization.  Follow Up Recommendations Acute inpatient rehab (3hours/day)    Assistance Recommended at Discharge PRN  Patient can return home with the following  A little help with walking and/or transfers;A little help with bathing/dressing/bathroom;Assist for transportation;Help with stairs or ramp for entrance    Equipment Recommendations None recommended by PT  Recommendations for Other Services  Rehab consult    Functional Status Assessment Patient has had a recent decline in their functional status and demonstrates the ability to make significant improvements in function in a reasonable and predictable amount of time.     Precautions / Restrictions Precautions Precautions: Fall Restrictions Weight Bearing Restrictions: No      Mobility  Bed  Mobility Overal bed mobility: Needs Assistance Bed Mobility: Sit to Supine     Supine to sit: Supervision     General bed mobility comments: supervision for safety, no physical assist required    Transfers Overall transfer level: Needs assistance Equipment used: Rolling walker (2 wheels) Transfers: Sit to/from Stand Sit to Stand: Min guard           General transfer comment: min guard to rise and steady, pt using momentum to power up    Ambulation/Gait Ambulation/Gait assistance: Min assist Gait Distance (Feet): 25 Feet Assistive device: Rolling walker (2 wheels) Gait Pattern/deviations: Step-through pattern;Decreased stride length;Drifts right/left Gait velocity: decreased     General Gait Details: Pt requiring consistent minA for negotiation to/from bathroom, cues for keeping feet on inside of walker, obstacle negotiation. required ~1 standing rest break  Stairs            Wheelchair Mobility    Modified Rankin (Stroke Patients Only)       Balance Overall balance assessment: Needs assistance Sitting-balance support: Feet supported Sitting balance-Leahy Scale: Fair     Standing balance support: Reliant on assistive device for balance Standing balance-Leahy Scale: Poor                               Pertinent Vitals/Pain Pain Assessment: No/denies pain    Home Living Family/patient expects to be discharged to:: Private residence Living Arrangements: Spouse/significant other Available Help at Discharge: Family Type of Home: House Home Access: Stairs to enter Entrance Stairs-Rails: None Entrance Stairs-Number of Steps: 2 steps Alternate Level Stairs-Number of Steps: 15 stairs Home Layout: Two level Home Equipment: Agricultural consultant (2 wheels);Shower seat      Prior Function Prior Level of Function : Independent/Modified Independent  Mobility Comments: uses a RW sometimes ADLs Comments: indep     Hand Dominance    Dominant Hand: Right    Extremity/Trunk Assessment   Upper Extremity Assessment Upper Extremity Assessment: Defer to OT evaluation    Lower Extremity Assessment Lower Extremity Assessment: RLE deficits/detail;LLE deficits/detail RLE Deficits / Details: strength 5/5 LLE Deficits / Details: strength 5/5       Communication   Communication: Prefers language other than English;HOH (Spanish speaking)  Cognition Arousal/Alertness: Awake/alert Behavior During Therapy: WFL for tasks assessed/performed Overall Cognitive Status: Within Functional Limits for tasks assessed                                          General Comments      Exercises     Assessment/Plan    PT Assessment Patient needs continued PT services  PT Problem List Decreased activity tolerance;Decreased balance;Decreased mobility;Decreased coordination       PT Treatment Interventions DME instruction;Gait training;Functional mobility training;Therapeutic exercise;Balance training;Therapeutic activities;Patient/family education    PT Goals (Current goals can be found in the Care Plan section)  Acute Rehab PT Goals Patient Stated Goal: less dizziness PT Goal Formulation: With patient Time For Goal Achievement: 04/06/21 Potential to Achieve Goals: Good    Frequency Min 4X/week     Co-evaluation               AM-PAC PT "6 Clicks" Mobility  Outcome Measure Help needed turning from your back to your side while in a flat bed without using bedrails?: None Help needed moving from lying on your back to sitting on the side of a flat bed without using bedrails?: A Little Help needed moving to and from a bed to a chair (including a wheelchair)?: A Little Help needed standing up from a chair using your arms (e.g., wheelchair or bedside chair)?: A Little Help needed to walk in hospital room?: A Little Help needed climbing 3-5 steps with a railing? : A Lot 6 Click Score: 18    End of Session  Equipment Utilized During Treatment: Gait belt Activity Tolerance: Patient tolerated treatment well Patient left: in chair;with call bell/phone within reach;with chair alarm set Nurse Communication: Mobility status PT Visit Diagnosis: Unsteadiness on feet (R26.81);Difficulty in walking, not elsewhere classified (R26.2)    Time: 9485-4627 PT Time Calculation (min) (ACUTE ONLY): 30 min   Charges:   PT Evaluation $PT Eval Moderate Complexity: 1 Mod PT Treatments $Gait Training: 8-22 mins        Lillia Pauls, PT, DPT Acute Rehabilitation Services Pager [phone] Office [phone]   Norval Morton 03/23/2021, 12:50 PM

## 2021-03-23 NOTE — Evaluation (Signed)
Speech Language Pathology Evaluation Patient Details Name: Ronny Korff MRN: [id number] DOB: 1935/09/01 Today's Date: 03/23/2021 Time: 7829-5621 SLP Time Calculation (min) (ACUTE ONLY): 23 min  Problem List:  Patient Active Problem List   Diagnosis Date Noted   Acute ischemic stroke (HCC) 03/21/2021   DM2 (diabetes mellitus, type 2) (HCC) 03/21/2021   HTN (hypertension) 03/21/2021   HLD (hyperlipidemia) 03/21/2021   OSA (obstructive sleep apnea) 03/21/2021   Hypothyroidism 03/21/2021   Smoking 03/21/2021   COPD (chronic obstructive pulmonary disease) (HCC) 03/21/2021   Past Medical History:  Past Medical History:  Diagnosis Date   Bilateral iliac artery aneurysm (HCC)    Diabetes mellitus without complication (HCC)    HTN (hypertension)    Hyperlipidemia    Hypothyroid    Obesity    OSA (obstructive sleep apnea)    Peripheral edema    Past Surgical History:  Past Surgical History:  Procedure Laterality Date   LEFT HEART CATH AND CORONARY ANGIOGRAPHY  03/2019   Mild Non-obstructive CAD   HPI:  Samnang Shugars is an 86 y.o. male with a history of uncontrolled T2DM, HTN, HLD, morbid obesity, OSA nonadherent to CPAP, smoking who presented to the ED with a day of unsteadiness, left posterior headache and vomiting found to have inferior/medial left cerebellum stroke   Assessment / Plan / Recommendation Clinical Impression  Pt demonstrates no cognitive lingutiic impiarment. Pt evalauted by Spanish speaking SLP. SLP confirmed with pts daughter that she does not observe any dysarthria as pts speech can sometimes seem slightly imprecise. No SLP f/u needed will sign off.    SLP Assessment  SLP Recommendation/Assessment: Patient does not need any further Speech Lanaguage Pathology Services    Recommendations for follow up therapy are one component of a multi-disciplinary discharge planning process, led by the attending physician.  Recommendations may be updated based on  patient status, additional functional criteria and insurance authorization.    Follow Up Recommendations       Assistance Recommended at Discharge     Functional Status Assessment    Frequency and Duration           SLP Evaluation Cognition  Overall Cognitive Status: Within Functional Limits for tasks assessed Orientation Level: Oriented X4       Comprehension  Auditory Comprehension Overall Auditory Comprehension: Appears within functional limits for tasks assessed    Expression Verbal Expression Overall Verbal Expression: Appears within functional limits for tasks assessed Written Expression Dominant Hand: Right   Oral / Motor  Motor Speech Overall Motor Speech: Appears within functional limits for tasks assessed            Carlitos Bottino, Riley Nearing 03/23/2021, 1:55 PM

## 2021-03-23 NOTE — Progress Notes (Addendum)
PROGRESS NOTE  Samuel Norris  ZOX:[id number] DOB: 1935-07-06 DOA: 03/21/2021 PCP: System, Provider Not In   Brief Narrative: Samuel Norris is an 86 y.o. 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 He was admitted for stroke evaluation and work up. DAPT has been added, high-intensity statin has been continued, though his symptoms remain. PT and OT have recommended inpatient rehabilitation which is being pursued.   Assessment & Plan: Principal Problem:   Acute ischemic stroke (HCC) Active Problems:   DM2 (diabetes mellitus, type 2) (HCC)   HTN (hypertension)   HLD (hyperlipidemia)   OSA (obstructive sleep apnea)   Hypothyroidism   Smoking   COPD (chronic obstructive pulmonary disease) (HCC)  Acute left cerebellar (PICA) stroke: Likely related to V4 vertebral artery occlusion. Also has chronic right PCA/PCA infarct. Echocardiogram shows no cardioembolic source.  - Etiology likely large vessel disease, left VA occlusion. Neurology recommends DAPT x3 months followed by monotherapy with plavix.  - LDL 72 > continue high intensity statin (atorvastatin 80mg ) - Smoking cessation, weight loss, and improved glycemic control all recommended as well.  - Follow up at [facility] in 4 weeks. - CIR disposition is appropriate and being pursued.  - Headache is likely due to associate cytotoxic cerebellar edema noted on CT. Tylenol and/or ibuprofen prn headache.  Uncontrolled T2DM, HLD: HbA1c 8.4% due to nonadherence to medications as outpatient.  - Continue moderate SSI while here. - Add linagliptin (formulary sub for home [place]). -  Plan to restart metformin, sitagliptin at discharge. - Discussed at length the need to better control this risk  factor.   HTN:  - Restart losartan tomorrow AM once outside window of permissive HTN.   Morbid obesity:  - BMI 41. Also discussed lifestyle modifications to reduce impact of this risk factor.  OSA:  - Recommend CPAP at discharge, though pt has not used this.  Tobacco use, COPD:  - Congratulated on quitting 3 days PTA. Reach out to PCP if needed for further assistance in maintaining abstinence.  - Continue symbicort, formulary.   Hypothyroidism:  - Continue synthroid  GERD:  - PPI  DVT prophylaxis: Lovenox Code Status: Full Family Communication: None at bedside Disposition Plan:  Status is: Inpatient pending CIR disposition  Consultants:  Neurology, Dr. Roda Shutters  Procedures:  Echocardiogram  Antimicrobials: None   Subjective: Ricki Ammirati, Spanish video interpretor [phone], was used throughout encounter. Symptoms overall slightly better, though still feels unsteady at all times, has headache on the left that is persistent, unchanged. Amenable to rehabilitation prior to going home, says he can't go home since he's this unsteady because his daughter works all day.   Objective: Vitals:   03/23/21 0416 03/23/21 0800 03/23/21 0850 03/23/21 1215  BP: (!) 162/85 (!) 142/70  (!) 150/70  Pulse: (!) 59 (!) 52  (!) 55  Resp: 20 19  19   Temp: 98.3 F (36.8 C) 98.8 F (37.1 C)  98.6 F (37 C)  TempSrc: Axillary Oral  Oral  SpO2: 96% 95% 93% 96%  Weight:      Height:        Intake/Output Summary (Last 24 hours) at 03/23/2021 1316 Last data filed at 03/23/2021 1021 Gross per 24 hour  Intake --  Output 1025 ml  Net -1025 ml  Filed Weights   03/21/21 1624  Weight: 120.2 kg   Gen: Obese elderly, pleasant male in no distress Pulm: Nonlabored breathing room air, diminished with prolonged expiration without wheezes or crackles. CV: Regular borderline bradycardia without murmur, rub, or gallop. No JVD, no pitting dependent edema. GI: Abdomen soft, non-tender, non-distended, with  normoactive bowel sounds.  Ext: Warm, no deformities Skin: No new rashes, lesions or ulcers on visualized skin. Neuro: Alert and oriented. Stable dysmetria, left-sided ataxia. No new focal neurological deficits. Psych: Judgement and insight appear fair. Mood euthymic & affect congruent. Behavior is appropriate.    Data Reviewed: I have personally reviewed following labs and imaging studies  CBC: Recent Labs  Lab 03/21/21 1635 03/21/21 1655  WBC 10.5  --   NEUTROABS 8.6*  --   HGB 15.2 16.7  HCT 46.5 49.0  MCV 91.7  --   PLT 192  --    Basic Metabolic Panel: Recent Labs  Lab 03/21/21 1635 03/21/21 1655  NA 135 135  K 4.0 3.9  CL 99 98  CO2 26  --   GLUCOSE 254* 256*  BUN 9 11  CREATININE 0.78 0.60*  CALCIUM 9.1  --    GFR: Estimated Creatinine Clearance: 86.8 mL/min (A) (by C-G formula based on SCr of 0.6 mg/dL (L)). Liver Function Tests: Recent Labs  Lab 03/21/21 1635  AST 46*  ALT 63*  ALKPHOS 89  BILITOT 0.8  PROT 7.1  ALBUMIN 3.4*   No results for input(s): LIPASE, AMYLASE in the last 168 hours. No results for input(s): AMMONIA in the last 168 hours. Coagulation Profile: Recent Labs  Lab 03/21/21 1635  INR 1.0   Cardiac Enzymes: No results for input(s): CKTOTAL, CKMB, CKMBINDEX, TROPONINI in the last 168 hours. BNP (last 3 results) No results for input(s): PROBNP in the last 8760 hours. HbA1C: Recent Labs    03/22/21 0310  HGBA1C 8.4*   CBG: Recent Labs  Lab 03/22/21 1210 03/22/21 1606 03/22/21 2158 03/23/21 0554 03/23/21 1253  GLUCAP 219* 170* 222* 156* 151*   Lipid Profile: Recent Labs    03/22/21 0310  CHOL 131  HDL 39*  LDLCALC 72  TRIG 99  CHOLHDL 3.4   Thyroid Function Tests: No results for input(s): TSH, T4TOTAL, FREET4, T3FREE, THYROIDAB in the last 72 hours. Anemia Panel: No results for input(s): VITAMINB12, FOLATE, FERRITIN, TIBC, IRON, RETICCTPCT in the last 72 hours. Urine analysis: No results found for:  COLORURINE, APPEARANCEUR, LABSPEC, PHURINE, GLUCOSEU, HGBUR, BILIRUBINUR, KETONESUR, PROTEINUR, UROBILINOGEN, NITRITE, LEUKOCYTESUR Recent Results (from the past 240 hour(s))  Resp Panel by RT-PCR (Flu A&B, Covid) Nasopharyngeal Swab     Status: None   Collection Time: 03/22/21  5:39 AM   Specimen: Nasopharyngeal Swab; Nasopharyngeal(NP) swabs in vial transport medium  Result Value Ref Range Status   SARS Coronavirus 2 by RT PCR NEGATIVE NEGATIVE Final    Comment: (NOTE) SARS-CoV-2 target nucleic acids are NOT DETECTED.  The SARS-CoV-2 RNA is generally detectable in upper respiratory specimens during the acute phase of infection. The lowest concentration of SARS-CoV-2 viral copies this assay can detect is 138 copies/mL. A negative result does not preclude SARS-Cov-2 infection and should not be used as the sole basis for treatment or other patient management decisions. A negative result may occur with  improper specimen collection/handling, submission of specimen other than nasopharyngeal swab, presence of viral mutation(s) within the areas targeted by this assay, and inadequate number of viral copies(<138 copies/mL). A negative result must be combined with  clinical observations, patient history, and epidemiological information. The expected result is Negative.  Fact Sheet for Patients:  BloggerCourse.comhttps://www.fda.gov/media/152166/download  Fact Sheet for Healthcare Providers:  SeriousBroker.ithttps://www.fda.gov/media/152162/download  This test is no t yet approved or cleared by the [place] FDA and  has been authorized for detection and/or diagnosis of SARS-CoV-2 by FDA under an Emergency Use Authorization (EUA). This EUA will remain  in effect (meaning this test can be used) for the duration of the COVID-19 declaration under Section 564(b)(1) of the Act, 21 U.S.C.section 360bbb-3(b)(1), unless the authorization is terminated  or revoked sooner.       Influenza A by PCR NEGATIVE NEGATIVE Final    Influenza B by PCR NEGATIVE NEGATIVE Final    Comment: (NOTE) The Xpert Xpress SARS-CoV-2/FLU/RSV plus assay is intended as an aid in the diagnosis of influenza from Nasopharyngeal swab specimens and should not be used as a sole basis for treatment. Nasal washings and aspirates are unacceptable for Xpert Xpress SARS-CoV-2/FLU/RSV testing.  Fact Sheet for Patients: BloggerCourse.comhttps://www.fda.gov/media/152166/download  Fact Sheet for Healthcare Providers: SeriousBroker.ithttps://www.fda.gov/media/152162/download  This test is not yet approved or cleared by the [place] FDA and has been authorized for detection and/or diagnosis of SARS-CoV-2 by FDA under an Emergency Use Authorization (EUA). This EUA will remain in effect (meaning this test can be used) for the duration of the COVID-19 declaration under Section 564(b)(1) of the Act, 21 U.S.C. section 360bbb-3(b)(1), unless the authorization is terminated or revoked.  Performed at [facility], [address] [place], [place] [address]       Radiology Studies: CT ANGIO HEAD NECK W WO CM  Result Date: 03/22/2021 CLINICAL DATA:  86 year old male with neurologic deficit. Acute left cerebellar infarct, abnormal distal left vertebral artery. EXAM: CT ANGIOGRAPHY HEAD AND NECK TECHNIQUE: Multidetector CT imaging of the head and neck was performed using the standard protocol during bolus administration of intravenous contrast. Multiplanar CT image reconstructions and MIPs were obtained to evaluate the vascular anatomy. Carotid stenosis measurements (when applicable) are obtained utilizing NASCET criteria, using the distal internal carotid diameter as the denominator. CONTRAST:  75mL OMNIPAQUE IOHEXOL 350 MG/ML SOLN COMPARISON:  Brain MRI and intracranial MRA yesterday. Head CT yesterday. FINDINGS: CT HEAD Brain: Cytotoxic edema now visible in the medial left cerebellum. No associated hemorrhage. No posterior fossa mass effect. Superimposed small chronic left  cerebellar infarct. Chronic encephalomalacia medial right PCA territory. Stable gray-white matter differentiation elsewhere. No midline shift, ventriculomegaly, mass effect, evidence of mass lesion, intracranial hemorrhage or new cortically based acute infarction. Calvarium and skull base: Negative. Paranasal sinuses: Visualized paranasal sinuses and mastoids are stable and well aerated. Orbits: No acute orbit or scalp soft tissue finding. CTA NECK Skeleton: Absent dentition. Multilevel cervical spine degeneration. No acute osseous abnormality identified. Upper chest: Superior mediastinal lipomatosis. No superior mediastinal lymphadenopathy. Mild mosaic attenuation in the upper lungs likely mild atelectasis and gas trapping. Atelectatic changes to the trachea at the thoracic inlet. Other neck: Retropharyngeal course of both carotid arteries (normal variant). No acute neck soft tissue finding. Aortic arch: 3 vessel arch configuration. Mild calcified arch atherosclerosis. Right carotid system: Tortuous brachiocephalic artery and proximal right CCA without plaque or stenosis. Retropharyngeal course of the right carotid. Minor calcified plaque at the posterior right ICA origin without stenosis. Tortuous right ICA distal to the bulb. Left carotid system: Tortuous proximal left CCA and retropharyngeal course without stenosis. Negative left carotid bifurcation. No stenosis. Vertebral arteries: Calcified plaque and tortuosity in the proximal right subclavian artery  without hemodynamically significant stenosis. Normal right vertebral artery origin. Right vertebral artery is patent and normal to the skull base. Proximal left subclavian artery is patent with mild ectasia and minimal atherosclerosis. Left vertebral artery is occluded just distal to its origin in the V1 segment (series 18, image 534). No left vertebral artery reconstitution to the skull base. CTA HEAD Posterior circulation: Right vertebral V4 segment is patent  to the basilar without stenosis. Right AICA appears dominant. There is partial retrograde reconstitution of the left vertebral V4 segment and the left PICA origin remains patent. Patent basilar artery without stenosis. AICA, SCA and PCA origins are patent. Posterior communicating arteries are diminutive or absent. Bilateral PCA branches are patent, but there is a short segment mild to moderate stenosis of the right P2 on series 14, image 27. Anterior circulation: Both ICA siphons are patent and mildly tortuous. Minimal siphon plaque. No ICA siphon stenosis. Patent carotid termini, MCA and ACA origins with mild tortuosity. Diminutive or absent anterior communicating artery. Bilateral MCA and ACA branches are patent with mild tortuosity and no significant stenosis. Venous sinuses: Patent. Anatomic variants: None. Review of the MIP images confirms the above findings IMPRESSION: 1. Positive for occluded Left Vertebral Artery just beyond its origin. Only partial reconstituted V4 segment but the left PICA origin appears to remain patent. 2. Mild to moderate right PCA P2 segment stenosis. But no other significant arterial stenosis in the head or neck. Generalized arterial tortuosity. Retrograde course of the carotids. 3. Expected CT appearance of the Left cerebellar infarct. No associated hemorrhage or mass effect. 4. No new intracranial abnormality. These results were communicated to Dr. Roda ShuttersXu at 10:31 am on 03/22/2021 by text page via the [facility] messaging system. Electronically Signed   By: Odessa FlemingH  Hall M.D.   On: 03/22/2021 10:35   CT HEAD WO CONTRAST  Result Date: 03/21/2021 CLINICAL DATA:  Neuro deficit, acute, stroke suspected Patient reports sudden onset of headache and dizziness. EXAM: CT HEAD WITHOUT CONTRAST TECHNIQUE: Contiguous axial images were obtained from the base of the skull through the vertex without intravenous contrast. COMPARISON:  None. FINDINGS: Brain: Focal area of low-density and loss of gray-white  differentiation in the right occipital lobe, series 3, image 18 and series 5, image 49. No acute intracranial hemorrhage. Generalized atrophy, normal for age. Mild periventricular white matter hypodensity typical of chronic small vessel ischemia. No subdural or extra-axial collection. No hydrocephalus. Basilar cisterns are patent. Vascular: No hyperdense vessel or unexpected calcification. Skull: No fracture or focal lesion. Sinuses/Orbits: Trace mucosal thickening of maxillary sinuses. No sinus fluid levels. No mastoid effusion. Bilateral cataract resection. Other: Small left frontal scalp lipoma. IMPRESSION: 1. Focal area of low-density and loss of gray-white differentiation in the right occipital lobe, suspicious for infarct, likely subacute. Consider further evaluation with MRI. 2. Age related atrophy and chronic small vessel ischemia. Electronically Signed   By: Narda RutherfordMelanie  Sanford M.D.   On: 03/21/2021 17:19   MR ANGIO HEAD WO CONTRAST  Result Date: 03/21/2021 CLINICAL DATA:  Acute neurologic deficit EXAM: MRI HEAD WITHOUT CONTRAST MRA HEAD WITHOUT CONTRAST TECHNIQUE: Multiplanar, multi-echo pulse sequences of the brain and surrounding structures were acquired without intravenous contrast. Angiographic images of the Circle of Willis were acquired using MRA technique without intravenous contrast. COMPARISON:  No pertinent prior exam. FINDINGS: MRI HEAD FINDINGS Brain: Small area of acute ischemia involving the inferior medial left cerebellar hemisphere. No acute or chronic hemorrhage. Normal white matter signal. Generalized volume loss without a clear lobar  predilection. The midline structures are normal. Skull and upper cervical spine: Normal calvarium and skull base. Visualized upper cervical spine and soft tissues are normal. Sinuses/Orbits:No paranasal sinus fluid levels or advanced mucosal thickening. No mastoid or middle ear effusion. Normal orbits. MRA HEAD FINDINGS POSTERIOR CIRCULATION: --Vertebral  arteries: Diminished flow related enhancement of the left vertebral artery V4 segment. Normal right V4. --Inferior cerebellar arteries: Normal. --Basilar artery: Normal. --Superior cerebellar arteries: Normal. --Posterior cerebral arteries: Normal. ANTERIOR CIRCULATION: --Intracranial internal carotid arteries: Normal. --Anterior cerebral arteries (ACA): Normal. --Middle cerebral arteries (MCA): Normal. ANATOMIC VARIANTS: None IMPRESSION: 1. Small acute infarct of the inferior medial left cerebellar hemisphere. No hemorrhage or mass effect. 2. Diminished flow related enhancement of the left vertebral artery V4 segment, consistent with high-grade stenosis. 3. Otherwise normal intracranial MRA. Electronically Signed   By: Deatra Robinson M.D.   On: 03/21/2021 21:26   MR BRAIN WO CONTRAST  Result Date: 03/21/2021 CLINICAL DATA:  Acute neurologic deficit EXAM: MRI HEAD WITHOUT CONTRAST MRA HEAD WITHOUT CONTRAST TECHNIQUE: Multiplanar, multi-echo pulse sequences of the brain and surrounding structures were acquired without intravenous contrast. Angiographic images of the Circle of Willis were acquired using MRA technique without intravenous contrast. COMPARISON:  No pertinent prior exam. FINDINGS: MRI HEAD FINDINGS Brain: Small area of acute ischemia involving the inferior medial left cerebellar hemisphere. No acute or chronic hemorrhage. Normal white matter signal. Generalized volume loss without a clear lobar predilection. The midline structures are normal. Skull and upper cervical spine: Normal calvarium and skull base. Visualized upper cervical spine and soft tissues are normal. Sinuses/Orbits:No paranasal sinus fluid levels or advanced mucosal thickening. No mastoid or middle ear effusion. Normal orbits. MRA HEAD FINDINGS POSTERIOR CIRCULATION: --Vertebral arteries: Diminished flow related enhancement of the left vertebral artery V4 segment. Normal right V4. --Inferior cerebellar arteries: Normal. --Basilar artery:  Normal. --Superior cerebellar arteries: Normal. --Posterior cerebral arteries: Normal. ANTERIOR CIRCULATION: --Intracranial internal carotid arteries: Normal. --Anterior cerebral arteries (ACA): Normal. --Middle cerebral arteries (MCA): Normal. ANATOMIC VARIANTS: None IMPRESSION: 1. Small acute infarct of the inferior medial left cerebellar hemisphere. No hemorrhage or mass effect. 2. Diminished flow related enhancement of the left vertebral artery V4 segment, consistent with high-grade stenosis. 3. Otherwise normal intracranial MRA. Electronically Signed   By: Deatra Robinson M.D.   On: 03/21/2021 21:26   ECHOCARDIOGRAM COMPLETE  Result Date: 03/23/2021    ECHOCARDIOGRAM REPORT   Patient Name:   SANJITH SIWEK Date of Exam: 03/23/2021 Medical Rec #:  [id number]          Height:       67.0 in Accession #:    [id number]         Weight:       265.0 lb Date of Birth:  [birth year]          BSA:          2.279 m Patient Age:    85 years           BP:           162/85 mmHg Patient Gender: M                  HR:           65 bpm. Exam Location:  Inpatient Procedure: 2D Echo Indications:    Stroke  History:        Patient has prior history of Echocardiogram examinations, most  recent 10/23/2020. Risk Factors:Hypertension and Diabetes.  Sonographer:    Eduard Roux Referring Phys: [phone] JARED M GARDNER IMPRESSIONS  1. Left ventricular ejection fraction, by estimation, is 60 to 65%. The left ventricle has normal function. The left ventricle has no regional wall motion abnormalities. There is mild left ventricular hypertrophy. Left ventricular diastolic parameters are consistent with Grade I diastolic dysfunction (impaired relaxation).  2. Right ventricular systolic function is normal. The right ventricular size is normal. Tricuspid regurgitation signal is inadequate for assessing PA pressure.  3. Left atrial size was mildly dilated.  4. The mitral valve is normal in structure. No evidence of mitral valve  regurgitation. No evidence of mitral stenosis.  5. The aortic valve is tricuspid. Aortic valve regurgitation is not visualized. Aortic valve sclerosis/calcification is present, without any evidence of aortic stenosis.  6. Aortic dilatation noted. There is mild dilatation of the ascending aorta, measuring 39 mm. FINDINGS  Left Ventricle: Left ventricular ejection fraction, by estimation, is 60 to 65%. The left ventricle has normal function. The left ventricle has no regional wall motion abnormalities. The left ventricular internal cavity size was normal in size. There is  mild left ventricular hypertrophy. Left ventricular diastolic parameters are consistent with Grade I diastolic dysfunction (impaired relaxation). Right Ventricle: The right ventricular size is normal. Right vetricular wall thickness was not well visualized. Right ventricular systolic function is normal. Tricuspid regurgitation signal is inadequate for assessing PA pressure. Left Atrium: Left atrial size was mildly dilated. Right Atrium: Right atrial size was normal in size. Pericardium: There is no evidence of pericardial effusion. Mitral Valve: The mitral valve is normal in structure. No evidence of mitral valve regurgitation. No evidence of mitral valve stenosis. Tricuspid Valve: The tricuspid valve is normal in structure. Tricuspid valve regurgitation is trivial. Aortic Valve: The aortic valve is tricuspid. Aortic valve regurgitation is not visualized. Aortic valve sclerosis/calcification is present, without any evidence of aortic stenosis. Aortic valve peak gradient measures 8.8 mmHg. Pulmonic Valve: The pulmonic valve was not well visualized. Pulmonic valve regurgitation is not visualized. Aorta: The aortic root is normal in size and structure and aortic dilatation noted. There is mild dilatation of the ascending aorta, measuring 39 mm. IAS/Shunts: The interatrial septum was not well visualized.  LEFT VENTRICLE PLAX 2D LVIDd:         4.77 cm    Diastology LVIDs:         3.33 cm   LV e' medial:    5.55 cm/s LV PW:         1.15 cm   LV E/e' medial:  12.2 LV IVS:        1.15 cm   LV e' lateral:   7.83 cm/s LVOT diam:     2.20 cm   LV E/e' lateral: 8.7 LV SV:         78 LV SV Index:   34 LVOT Area:     3.80 cm  RIGHT VENTRICLE RV Basal diam:  2.93 cm RV S prime:     13.40 cm/s TAPSE (M-mode): 3.7 cm LEFT ATRIUM             Index        RIGHT ATRIUM           Index LA diam:        4.00 cm 1.76 cm/m   RA Area:     17.50 cm 7.68 cm/m LA Vol (A2C):   85.3 ml 37.43 ml/m LA Vol (A4C):  66.5 ml 29.18 ml/m LA Biplane Vol: 78.0 ml 34.22 ml/m  AORTIC VALVE                 PULMONIC VALVE AV Area (Vmax): 2.64 cm     PV Vmax:       0.94 m/s AV Vmax:        148.50 cm/s  PV Peak grad:  3.5 mmHg AV Peak Grad:   8.8 mmHg LVOT Vmax:      103.00 cm/s LVOT Vmean:     61.000 cm/s LVOT VTI:       0.204 m  AORTA Ao Root diam: 3.80 cm Ao Asc diam:  3.90 cm MITRAL VALVE MV Area (PHT): 3.34 cm    SHUNTS MV Decel Time: 227 msec    Systemic VTI:  0.20 m MV E velocity: 67.90 cm/s  Systemic Diam: 2.20 cm MV A velocity: 91.80 cm/s MV E/A ratio:  0.74 Epifanio Lesches MD Electronically signed by Epifanio Lesches MD Signature Date/Time: 03/23/2021/11:43:01 AM    Final     Scheduled Meds:  aspirin EC  325 mg Oral Daily   atorvastatin  80 mg Oral QHS   clopidogrel  75 mg Oral Daily   enoxaparin (LOVENOX) injection  40 mg Subcutaneous Q24H   fluorometholone  1 drop Both Eyes Daily   insulin aspart  0-15 Units Subcutaneous TID WC   insulin aspart  0-5 Units Subcutaneous QHS   levothyroxine  88 mcg Oral QAC breakfast   mometasone-formoterol  2 puff Inhalation BID   pantoprazole  40 mg Oral Daily   Continuous Infusions:   LOS: 2 days    Tyrone Nine, MD [facility] [email or web address] 03/23/2021, 1:16 PM

## 2021-03-23 NOTE — Progress Notes (Signed)
Inpatient Rehabilitation Admissions Coordinator     I met with patient at bedside with use of interpretor [facility] # [phone]. I discussed goals and expectations of a possible Cir admit.He would prefer to d/c directly home, but his daughter works 8 am until 10 pm daily. He is open for me to begin Auth with [place] and I can follow up with him tomorrow. He complains of a headache when he coughs and is asking about this BP meds. I will alert Dr Bonner Puna and his nurse by secure chat of his question about BP meds.  Danne Baxter, RN, MSN Rehab Admissions Coordinator [phone] 03/23/2021 1:03 PM

## 2021-03-24 DIAGNOSIS — E785 Hyperlipidemia, unspecified: Secondary | ICD-10-CM | POA: Diagnosis not present

## 2021-03-24 DIAGNOSIS — J449 Chronic obstructive pulmonary disease, unspecified: Secondary | ICD-10-CM | POA: Diagnosis not present

## 2021-03-24 DIAGNOSIS — E1159 Type 2 diabetes mellitus with other circulatory complications: Secondary | ICD-10-CM | POA: Diagnosis not present

## 2021-03-24 DIAGNOSIS — I639 Cerebral infarction, unspecified: Secondary | ICD-10-CM | POA: Diagnosis not present

## 2021-03-24 LAB — GLUCOSE, CAPILLARY
Glucose-Capillary: 168 mg/dL — ABNORMAL HIGH (ref 70–99)
Glucose-Capillary: 183 mg/dL — ABNORMAL HIGH (ref 70–99)
Glucose-Capillary: 162 mg/dL — ABNORMAL HIGH (ref 70–99)
Glucose-Capillary: 182 mg/dL — ABNORMAL HIGH (ref 70–99)

## 2021-03-24 MED ORDER — MELATONIN 5 MG PO TABS
10.0000 mg | ORAL_TABLET | Freq: Every evening | ORAL | Status: DC | PRN
  Administered 2021-03-24 (×2): 10 mg via ORAL
  Filled 2021-03-24 (×2): qty 2

## 2021-03-24 MED ORDER — ENOXAPARIN SODIUM 60 MG/0.6ML IJ SOSY
60.0000 mg | PREFILLED_SYRINGE | INTRAMUSCULAR | Status: DC
  Administered 2021-03-25: 60 mg via SUBCUTANEOUS
  Filled 2021-03-24: qty 0.6

## 2021-03-24 NOTE — Progress Notes (Signed)
PROGRESS NOTE  Samuel Norris  ZOX:[id number] DOB: [birth year] DOA: 03/21/2021 PCP: System, Provider Not In   Brief Narrative: Samuel Norris is an 86 y.o. 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 He was admitted for stroke evaluation and work up. DAPT has been added, high-intensity statin has been continued, though his symptoms remain. PT and OT have recommended inpatient rehabilitation which is being pursued.   Assessment & Plan: Principal Problem:   Acute ischemic stroke (HCC) Active Problems:   DM2 (diabetes mellitus, type 2) (HCC)   HTN (hypertension)   HLD (hyperlipidemia)   OSA (obstructive sleep apnea)   Hypothyroidism   Smoking   COPD (chronic obstructive pulmonary disease) (HCC)  Acute left cerebellar (PICA) stroke: Likely related to V4 vertebral artery occlusion. Also has chronic right PCA/PCA infarct. Echocardiogram shows no cardioembolic source.  - Etiology likely large vessel disease, left VA occlusion. Neurology recommends DAPT x3 months followed by monotherapy with plavix.  - LDL 72 > continue high intensity statin (atorvastatin 80mg ) - Smoking cessation, weight loss, and improved glycemic control all recommended as well.  - Follow up at [facility] in 4 weeks. - CIR disposition is appropriate and being pursued.  - Headache is likely due to associate cytotoxic cerebellar edema noted on CT. Tylenol and/or ibuprofen prn headache. Improving.  Uncontrolled T2DM, HLD: HbA1c 8.4% due to nonadherence to medications as outpatient.  - Continue moderate SSI while here. - Add linagliptin (formulary sub for home [place]). - Plan to restart metformin, sitagliptin at discharge. - Discussed at length the need to better control  this risk factor.   HTN:  - Restart losartan   Morbid obesity:  - BMI 41. Discussed lifestyle modifications to reduce impact of this risk factor.  OSA:  - Recommend CPAP at discharge, though pt has not used this.  Tobacco use, COPD:  - Congratulated on quitting 3 days PTA. Reach out to PCP if needed for further assistance in maintaining abstinence.  - Continue symbicort, formulary.   Hypothyroidism:  - Continue synthroid  GERD:  - PPI  DVT prophylaxis: Lovenox Code Status: Full Family Communication: None at bedside Disposition Plan:  Status is: Inpatient. Medically stable for CIR disposition  Consultants:  Neurology, Dr. Roda Shutters  Procedures:  Echocardiogram  Antimicrobials: None   Subjective: Samuel Norris, Spanish video interpretor [phone], was used throughout encounter. No new complaints, still feels unsteady, even moving in chair. Eager to continue PT and get stronger, more steady. No new complaints.   Objective: Vitals:   03/23/21 2300 03/24/21 0459 03/24/21 0730 03/24/21 1152  BP: 137/61 (!) 152/53 121/80 134/66  Pulse: (!) 54 (!) 56 (!) 55 (!) 55  Resp: 17 18 20 20   Temp: 98.6 F (37 C) 98.4 F (36.9 C) 99.1 F (37.3 C) 97.8 F (36.6 C)  TempSrc: Oral Oral Oral Oral  SpO2: 96% 98% 97% 98%  Weight:      Height:        Intake/Output Summary (Last 24 hours) at 03/24/2021 1446 Last data filed at 03/24/2021 1143 Gross per 24 hour  Intake 560 ml  Output 1350 ml  Net -790 ml   Filed Weights   03/21/21 1624  Weight: 120.2 kg   Gen: 86 y.o. male in no distress Pulm: Nonlabored breathing room air.  Clear. CV: Regular rate and rhythm. No murmur, rub, or gallop. No JVD, no pitting dependent edema. GI: Abdomen soft, non-tender, non-distended, with normoactive bowel sounds.  Ext: Warm, no deformities Skin: No rashes, lesions or ulcers on visualized skin. Neuro: Alert and oriented. Ataxia noted. No new focal neurological deficits. Psych: Judgement and insight appear  fair. Mood euthymic & affect congruent. Behavior is appropriate.    Data Reviewed: I have personally reviewed following labs and imaging studies  CBC: Recent Labs  Lab 03/21/21 1635 03/21/21 1655  WBC 10.5  --   NEUTROABS 8.6*  --   HGB 15.2 16.7  HCT 46.5 49.0  MCV 91.7  --   PLT 192  --    Basic Metabolic Panel: Recent Labs  Lab 03/21/21 1635 03/21/21 1655  NA 135 135  K 4.0 3.9  CL 99 98  CO2 26  --   GLUCOSE 254* 256*  BUN 9 11  CREATININE 0.78 0.60*  CALCIUM 9.1  --    GFR: Estimated Creatinine Clearance: 86.8 mL/min (A) (by C-G formula based on SCr of 0.6 mg/dL (L)). Liver Function Tests: Recent Labs  Lab 03/21/21 1635  AST 46*  ALT 63*  ALKPHOS 89  BILITOT 0.8  PROT 7.1  ALBUMIN 3.4*   No results for input(s): LIPASE, AMYLASE in the last 168 hours. No results for input(s): AMMONIA in the last 168 hours. Coagulation Profile: Recent Labs  Lab 03/21/21 1635  INR 1.0   Cardiac Enzymes: No results for input(s): CKTOTAL, CKMB, CKMBINDEX, TROPONINI in the last 168 hours. BNP (last 3 results) No results for input(s): PROBNP in the last 8760 hours. HbA1C: Recent Labs    03/22/21 0310  HGBA1C 8.4*   CBG: Recent Labs  Lab 03/23/21 1253 03/23/21 1648 03/23/21 1945 03/24/21 0612 03/24/21 1152  GLUCAP 151* 216* 196* 168* 183*   Lipid Profile: Recent Labs    03/22/21 0310  CHOL 131  HDL 39*  LDLCALC 72  TRIG 99  CHOLHDL 3.4   Thyroid Function Tests: No results for input(s): TSH, T4TOTAL, FREET4, T3FREE, THYROIDAB in the last 72 hours. Anemia Panel: No results for input(s): VITAMINB12, FOLATE, FERRITIN, TIBC, IRON, RETICCTPCT in the last 72 hours. Urine analysis: No results found for: COLORURINE, APPEARANCEUR, LABSPEC, PHURINE, GLUCOSEU, HGBUR, BILIRUBINUR, KETONESUR, PROTEINUR, UROBILINOGEN, NITRITE, LEUKOCYTESUR Recent Results (from the past 240 hour(s))  Resp Panel by RT-PCR (Flu A&B, Covid) Nasopharyngeal Swab     Status: None    Collection Time: 03/22/21  5:39 AM   Specimen: Nasopharyngeal Swab; Nasopharyngeal(NP) swabs in vial transport medium  Result Value Ref Range Status   SARS Coronavirus 2 by RT PCR NEGATIVE NEGATIVE Final    Comment: (NOTE) SARS-CoV-2 target nucleic acids are NOT DETECTED.  The SARS-CoV-2 RNA is generally detectable in upper respiratory specimens during the acute phase of infection. The lowest concentration of SARS-CoV-2 viral copies this assay can detect is 138 copies/mL. A negative result does not preclude SARS-Cov-2 infection and should not be used as the sole basis for treatment or other patient management decisions. A negative result may occur with  improper specimen collection/handling, submission of specimen other than nasopharyngeal swab, presence of viral mutation(s) within the areas targeted by this assay, and inadequate number of viral copies(<138 copies/mL). A negative result must be combined with clinical observations, patient history, and epidemiological information. The expected result is Negative.  Fact Sheet for Patients:  BloggerCourse.comhttps://www.fda.gov/media/152166/download  Fact Sheet for Healthcare Providers:  SeriousBroker.ithttps://www.fda.gov/media/152162/download  This test is no t yet  approved or cleared by the [place] and  has been authorized for detection and/or diagnosis of SARS-CoV-2 by FDA under an Emergency Use Authorization (EUA). This EUA will remain  in effect (meaning this test can be used) for the duration of the COVID-19 declaration under Section 564(b)(1) of the Act, 21 U.S.C.section 360bbb-3(b)(1), unless the authorization is terminated  or revoked sooner.       Influenza A by PCR NEGATIVE NEGATIVE Final   Influenza B by PCR NEGATIVE NEGATIVE Final    Comment: (NOTE) The Xpert Xpress SARS-CoV-2/FLU/RSV plus assay is intended as an aid in the diagnosis of influenza from Nasopharyngeal swab specimens and should not be used as a sole basis for treatment.  Nasal washings and aspirates are unacceptable for Xpert Xpress SARS-CoV-2/FLU/RSV testing.  Fact Sheet for Patients: BloggerCourse.com  Fact Sheet for Healthcare Providers: SeriousBroker.it  This test is not yet approved or cleared by the [place] FDA and has been authorized for detection and/or diagnosis of SARS-CoV-2 by FDA under an Emergency Use Authorization (EUA). This EUA will remain in effect (meaning this test can be used) for the duration of the COVID-19 declaration under Section 564(b)(1) of the Act, 21 U.S.C. section 360bbb-3(b)(1), unless the authorization is terminated or revoked.  Performed at [facility], [address], [place], [place] [address]       Radiology Studies: ECHOCARDIOGRAM COMPLETE  Result Date: 03/23/2021    ECHOCARDIOGRAM REPORT   Patient Name:   KEEFE ZAWISTOWSKI Date of Exam: 03/23/2021 Medical Rec #:  [id number]          Height:       67.0 in Accession #:    [id number]         Weight:       265.0 lb Date of Birth:  11-Jun-1935          BSA:          2.279 m Patient Age:    85 years           BP:           162/85 mmHg Patient Gender: M                  HR:           65 bpm. Exam Location:  Inpatient Procedure: 2D Echo Indications:    Stroke  History:        Patient has prior history of Echocardiogram examinations, most                 recent 10/23/2020. Risk Factors:Hypertension and Diabetes.  Sonographer:    Eduard Roux Referring Phys: [phone] JARED M GARDNER IMPRESSIONS  1. Left ventricular ejection fraction, by estimation, is 60 to 65%. The left ventricle has normal function. The left ventricle has no regional wall motion abnormalities. There is mild left ventricular hypertrophy. Left ventricular diastolic parameters are consistent with Grade I diastolic dysfunction (impaired relaxation).  2. Right ventricular systolic function is normal. The right ventricular size is normal. Tricuspid  regurgitation signal is inadequate for assessing PA pressure.  3. Left atrial size was mildly dilated.  4. The mitral valve is normal in structure. No evidence of mitral valve regurgitation. No evidence of mitral stenosis.  5. The aortic valve is tricuspid. Aortic valve regurgitation is not visualized. Aortic valve sclerosis/calcification is present, without any evidence of aortic stenosis.  6. Aortic dilatation noted. There is mild dilatation of the ascending aorta, measuring 39 mm. FINDINGS  Left Ventricle: Left ventricular ejection fraction, by estimation, is 60 to 65%. The left ventricle has normal function. The left ventricle has no regional wall motion abnormalities. The left ventricular internal cavity size was normal in size. There is  mild left ventricular hypertrophy. Left ventricular diastolic parameters are consistent with Grade I diastolic dysfunction (impaired relaxation). Right Ventricle: The right ventricular size is normal. Right vetricular wall thickness was not well visualized. Right ventricular systolic function is normal. Tricuspid regurgitation signal is inadequate for assessing PA pressure. Left Atrium: Left atrial size was mildly dilated. Right Atrium: Right atrial size was normal in size. Pericardium: There is no evidence of pericardial effusion. Mitral Valve: The mitral valve is normal in structure. No evidence of mitral valve regurgitation. No evidence of mitral valve stenosis. Tricuspid Valve: The tricuspid valve is normal in structure. Tricuspid valve regurgitation is trivial. Aortic Valve: The aortic valve is tricuspid. Aortic valve regurgitation is not visualized. Aortic valve sclerosis/calcification is present, without any evidence of aortic stenosis. Aortic valve peak gradient measures 8.8 mmHg. Pulmonic Valve: The pulmonic valve was not well visualized. Pulmonic valve regurgitation is not visualized. Aorta: The aortic root is normal in size and structure and aortic dilatation noted.  There is mild dilatation of the ascending aorta, measuring 39 mm. IAS/Shunts: The interatrial septum was not well visualized.  LEFT VENTRICLE PLAX 2D LVIDd:         4.77 cm   Diastology LVIDs:         3.33 cm   LV e' medial:    5.55 cm/s LV PW:         1.15 cm   LV E/e' medial:  12.2 LV IVS:        1.15 cm   LV e' lateral:   7.83 cm/s LVOT diam:     2.20 cm   LV E/e' lateral: 8.7 LV SV:         78 LV SV Index:   34 LVOT Area:     3.80 cm  RIGHT VENTRICLE RV Basal diam:  2.93 cm RV S prime:     13.40 cm/s TAPSE (M-mode): 3.7 cm LEFT ATRIUM             Index        RIGHT ATRIUM           Index LA diam:        4.00 cm 1.76 cm/m   RA Area:     17.50 cm 7.68 cm/m LA Vol (A2C):   85.3 ml 37.43 ml/m LA Vol (A4C):   66.5 ml 29.18 ml/m LA Biplane Vol: 78.0 ml 34.22 ml/m  AORTIC VALVE                 PULMONIC VALVE AV Area (Vmax): 2.64 cm     PV Vmax:       0.94 m/s AV Vmax:        148.50 cm/s  PV Peak grad:  3.5 mmHg AV Peak Grad:   8.8 mmHg LVOT Vmax:      103.00 cm/s LVOT Vmean:     61.000 cm/s LVOT VTI:       0.204 m  AORTA Ao Root diam: 3.80 cm Ao Asc diam:  3.90 cm MITRAL VALVE MV Area (PHT): 3.34 cm    SHUNTS MV Decel Time: 227 msec    Systemic VTI:  0.20 m MV E velocity: 67.90 cm/s  Systemic Diam: 2.20 cm MV A velocity: 91.80 cm/s MV E/A ratio:  0.74 Epifanio Lescheshristopher Schumann MD Electronically signed by Epifanio Lescheshristopher Schumann MD Signature Date/Time: 03/23/2021/11:43:01 AM    Final     Scheduled Meds:  aspirin EC  325 mg Oral Daily   atorvastatin  80 mg Oral QHS   clopidogrel  75 mg Oral Daily   [START ON 03/25/2021] enoxaparin (LOVENOX) injection  60 mg Subcutaneous Q24H   fluorometholone  1 drop Both Eyes Daily   insulin aspart  0-15 Units Subcutaneous TID WC   insulin aspart  0-5 Units Subcutaneous QHS   levothyroxine  88 mcg Oral QAC breakfast   linagliptin  5 mg Oral Daily   losartan  100 mg Oral Daily   mometasone-formoterol  2 puff Inhalation BID   pantoprazole  40 mg Oral Daily   Continuous  Infusions:   LOS: 3 days    Tyrone Nineyan B Orli Degrave, MD [facility] [email or web address] 03/24/2021, 2:46 PM

## 2021-03-24 NOTE — Progress Notes (Signed)
Inpatient Rehabilitation Admissions Coordinator   I await insurance approval for CIR admit. I will follow up tomorrow for no further CIR beds today.  Ottie Glazier, RN, MSN Rehab Admissions Coordinator [phone] 03/24/2021 2:02 PM

## 2021-03-24 NOTE — Progress Notes (Addendum)
Physical Therapy Treatment Patient Details Name: Samuel Norris MRN: [id number] DOB: 05/14/1935 Today's Date: 03/24/2021   History of Present Illness 86 y.o. M admitted on 03/21/21 due to unsteadiness, left posterior headache and vomiting. MRI showed inferior/medial left cerebellum stroke. PMH significant for uncontrolled T2DM, HTN, HLD, morbid obesity, OSA nonadherent to CPAP, smoking.    PT Comments    Patient progressing and able to increase distance with ambulation this session as well as able to initiate stiar training.  He remains high fall risk and being alone all day with two level home with fall risk unsafe.  Feel he will benefit from acute inpatient rehab to return to independent prior to d/c home.    Utilized Runner, broadcasting/film/video Edmund throughout session.   Recommendations for follow up therapy are one component of a multi-disciplinary discharge planning process, led by the attending physician.  Recommendations may be updated based on patient status, additional functional criteria and insurance authorization.  Follow Up Recommendations  Acute inpatient rehab (3hours/day)     Assistance Recommended at Discharge PRN  Patient can return home with the following A little help with walking and/or transfers;A little help with bathing/dressing/bathroom;Assist for transportation;Help with stairs or ramp for entrance   Equipment Recommendations  None recommended by PT    Recommendations for Other Services       Precautions / Restrictions Precautions Precautions: Fall     Mobility  Bed Mobility               General bed mobility comments: up in recliner    Transfers Overall transfer level: Needs assistance Equipment used: Rolling walker (2 wheels) Transfers: Sit to/from Stand Sit to Stand: Min guard           General transfer comment: min guard to rise and steady, pt using momentum to power up    Ambulation/Gait Ambulation/Gait assistance: Min assist Gait  Distance (Feet): 120 Feet (& 100') Assistive device: Rolling walker (2 wheels) Gait Pattern/deviations: Step-through pattern;Decreased stride length;Drifts right/left       General Gait Details: min A for balance/safety, stopped to rest and check HR and reposition heart monitor HR 67.   Stairs Stairs: Yes Stairs assistance: Min assist Stair Management: Alternating pattern;Forwards;Two rails;Step to pattern Number of Stairs: 3 General stair comments: assist for balance/safety   Wheelchair Mobility    Modified Rankin (Stroke Patients Only) Modified Rankin (Stroke Patients Only) Pre-Morbid Rankin Score: No significant disability Modified Rankin: Moderately severe disability     Balance Overall balance assessment: Needs assistance Sitting-balance support: Feet supported Sitting balance-Leahy Scale: Good     Standing balance support: Reliant on assistive device for balance Standing balance-Leahy Scale: Poor                              Cognition Arousal/Alertness: Awake/alert Behavior During Therapy: WFL for tasks assessed/performed Overall Cognitive Status: Within Functional Limits for tasks assessed                                          Exercises      General Comments General comments (skin integrity, edema, etc.): VSS with activity; discussed plan for rehab as pt will be alone at home 8 am to 9 pm and need to be mod I with mobility      Pertinent Vitals/Pain Pain Assessment: No/denies pain  Home Living                          Prior Function            PT Goals (current goals can now be found in the care plan section) Progress towards PT goals: Progressing toward goals    Frequency    Min 4X/week      PT Plan Current plan remains appropriate    Co-evaluation              AM-PAC PT "6 Clicks" Mobility   Outcome Measure  Help needed turning from your back to your side while in a flat bed  without using bedrails?: A Little Help needed moving from lying on your back to sitting on the side of a flat bed without using bedrails?: A Little Help needed moving to and from a bed to a chair (including a wheelchair)?: A Little Help needed standing up from a chair using your arms (e.g., wheelchair or bedside chair)?: A Little Help needed to walk in hospital room?: A Little Help needed climbing 3-5 steps with a railing? : A Lot 6 Click Score: 17    End of Session Equipment Utilized During Treatment: Gait belt Activity Tolerance: Patient tolerated treatment well Patient left: in chair;with call bell/phone within reach;with chair alarm set   PT Visit Diagnosis: Unsteadiness on feet (R26.81);Difficulty in walking, not elsewhere classified (R26.2)     Time: 6378-5885 PT Time Calculation (min) (ACUTE ONLY): 38 min  Charges:  $Gait Training: 23-37 mins                     Sheran Lawless, PT Acute Rehabilitation Services Pager:[phone] Office:[phone] 03/24/2021    Samuel Norris 03/24/2021, 5:32 PM

## 2021-03-25 ENCOUNTER — Encounter (HOSPITAL_COMMUNITY): Payer: Self-pay | Admitting: Physical Medicine & Rehabilitation

## 2021-03-25 ENCOUNTER — Other Ambulatory Visit: Payer: Self-pay

## 2021-03-25 ENCOUNTER — Inpatient Hospital Stay (HOSPITAL_COMMUNITY)
Admission: RE | Admit: 2021-03-25 | Discharge: 2021-04-01 | DRG: 057 | Disposition: A | Discharge status: Home Health | Payer: Medicare Other | Source: Intra-hospital | Attending: Physical Medicine & Rehabilitation | Admitting: Physical Medicine & Rehabilitation

## 2021-03-25 DIAGNOSIS — I69398 Other sequelae of cerebral infarction: Secondary | ICD-10-CM | POA: Diagnosis present

## 2021-03-25 DIAGNOSIS — K5900 Constipation, unspecified: Secondary | ICD-10-CM | POA: Diagnosis not present

## 2021-03-25 DIAGNOSIS — Z7951 Long term (current) use of inhaled steroids: Secondary | ICD-10-CM

## 2021-03-25 DIAGNOSIS — Z6841 Body Mass Index (BMI) 40.0 and over, adult: Secondary | ICD-10-CM | POA: Diagnosis not present

## 2021-03-25 DIAGNOSIS — E785 Hyperlipidemia, unspecified: Secondary | ICD-10-CM | POA: Diagnosis present

## 2021-03-25 DIAGNOSIS — E669 Obesity, unspecified: Secondary | ICD-10-CM | POA: Diagnosis present

## 2021-03-25 DIAGNOSIS — M62838 Other muscle spasm: Secondary | ICD-10-CM | POA: Diagnosis present

## 2021-03-25 DIAGNOSIS — I63542 Cerebral infarction due to unspecified occlusion or stenosis of left cerebellar artery: Secondary | ICD-10-CM | POA: Diagnosis present

## 2021-03-25 DIAGNOSIS — I69393 Ataxia following cerebral infarction: Secondary | ICD-10-CM

## 2021-03-25 DIAGNOSIS — E119 Type 2 diabetes mellitus without complications: Secondary | ICD-10-CM | POA: Diagnosis present

## 2021-03-25 DIAGNOSIS — Z7984 Long term (current) use of oral hypoglycemic drugs: Secondary | ICD-10-CM

## 2021-03-25 DIAGNOSIS — E039 Hypothyroidism, unspecified: Secondary | ICD-10-CM | POA: Diagnosis present

## 2021-03-25 DIAGNOSIS — Z823 Family history of stroke: Secondary | ICD-10-CM | POA: Diagnosis not present

## 2021-03-25 DIAGNOSIS — Z79899 Other long term (current) drug therapy: Secondary | ICD-10-CM

## 2021-03-25 DIAGNOSIS — G4733 Obstructive sleep apnea (adult) (pediatric): Secondary | ICD-10-CM | POA: Diagnosis present

## 2021-03-25 DIAGNOSIS — I10 Essential (primary) hypertension: Secondary | ICD-10-CM | POA: Diagnosis present

## 2021-03-25 DIAGNOSIS — Z794 Long term (current) use of insulin: Secondary | ICD-10-CM | POA: Diagnosis not present

## 2021-03-25 DIAGNOSIS — Z79890 Hormone replacement therapy (postmenopausal): Secondary | ICD-10-CM | POA: Diagnosis not present

## 2021-03-25 DIAGNOSIS — G4700 Insomnia, unspecified: Secondary | ICD-10-CM | POA: Diagnosis present

## 2021-03-25 DIAGNOSIS — I639 Cerebral infarction, unspecified: Secondary | ICD-10-CM | POA: Diagnosis not present

## 2021-03-25 DIAGNOSIS — F17210 Nicotine dependence, cigarettes, uncomplicated: Secondary | ICD-10-CM | POA: Diagnosis present

## 2021-03-25 LAB — GLUCOSE, CAPILLARY
Glucose-Capillary: 158 mg/dL — ABNORMAL HIGH (ref 70–99)
Glucose-Capillary: 191 mg/dL — ABNORMAL HIGH (ref 70–99)
Glucose-Capillary: 171 mg/dL — ABNORMAL HIGH (ref 70–99)
Glucose-Capillary: 193 mg/dL — ABNORMAL HIGH (ref 70–99)

## 2021-03-25 LAB — CREATININE, SERUM
Creatinine, Ser: 1.01 mg/dL (ref 0.61–1.24)
GFR, Estimated: >60 mL/min (ref >60)

## 2021-03-25 LAB — CBC
HCT: 45.4 % (ref 39.0–52.0)
Hemoglobin: 15 g/dL (ref 13.0–17.0)
MCH: 30.1 pg (ref 26.0–34.0)
MCHC: 33 g/dL (ref 30.0–36.0)
MCV: 91 fL (ref 80.0–100.0)
Platelets: 200 10*3/uL (ref 150–400)
RBC: 4.99 MIL/uL (ref 4.22–5.81)
RDW: 15.7 % — ABNORMAL HIGH (ref 11.5–15.5)
WBC: 10 10*3/uL (ref 4.0–10.5)
nRBC: 0 % (ref 0.0–0.2)

## 2021-03-25 MED ORDER — CYCLOBENZAPRINE HCL 10 MG PO TABS
10.0000 mg | ORAL_TABLET | Freq: Three times a day (TID) | ORAL | Status: DC | PRN
  Administered 2021-03-25 – 2021-03-30 (×5): 10 mg via ORAL
  Filled 2021-03-25 (×5): qty 1

## 2021-03-25 MED ORDER — TOPIRAMATE 25 MG PO TABS
25.0000 mg | ORAL_TABLET | Freq: Every day | ORAL | Status: DC
  Administered 2021-03-25 – 2021-03-31 (×7): 25 mg via ORAL
  Filled 2021-03-25 (×7): qty 1

## 2021-03-25 MED ORDER — CLOPIDOGREL BISULFATE 75 MG PO TABS: 75.0000 mg | ORAL_TABLET | Freq: Every day | ORAL | Status: DC

## 2021-03-25 MED ORDER — LIVING WELL WITH DIABETES BOOK - IN SPANISH
Freq: Once | Status: AC
  Filled 2021-03-25: qty 1

## 2021-03-25 MED ORDER — ONDANSETRON HCL 4 MG PO TABS: 4.0000 mg | ORAL_TABLET | Freq: Three times a day (TID) | ORAL | Status: DC | PRN

## 2021-03-25 MED ORDER — LORATADINE 10 MG PO TABS: 10.0000 mg | ORAL_TABLET | Freq: Every day | ORAL | Status: DC | PRN

## 2021-03-25 MED ORDER — INSULIN ASPART 100 UNIT/ML IJ SOLN
0.0000 [IU] | Freq: Three times a day (TID) | INTRAMUSCULAR | 11 refills | Status: DC
Start: 2021-03-25 — End: 2021-04-01

## 2021-03-25 MED ORDER — ATORVASTATIN CALCIUM 80 MG PO TABS
80.0000 mg | ORAL_TABLET | Freq: Every day | ORAL | Status: DC
  Administered 2021-03-25 – 2021-03-31 (×7): 80 mg via ORAL
  Filled 2021-03-25 (×7): qty 1

## 2021-03-25 MED ORDER — PANTOPRAZOLE SODIUM 40 MG PO TBEC
40.0000 mg | DELAYED_RELEASE_TABLET | Freq: Every day | ORAL | Status: DC
  Administered 2021-03-26 – 2021-04-01 (×7): 40 mg via ORAL
  Filled 2021-03-25 (×7): qty 1

## 2021-03-25 MED ORDER — ENOXAPARIN SODIUM 60 MG/0.6ML IJ SOSY
60.0000 mg | PREFILLED_SYRINGE | INTRAMUSCULAR | Status: DC
  Administered 2021-03-26 – 2021-04-01 (×7): 60 mg via SUBCUTANEOUS
  Filled 2021-03-25 (×7): qty 0.6

## 2021-03-25 MED ORDER — MELATONIN 10 MG PO TABS: 10.0000 mg | ORAL_TABLET | Freq: Every evening | ORAL | 0 refills | Status: DC | PRN

## 2021-03-25 MED ORDER — INSULIN ASPART 100 UNIT/ML IJ SOLN: 0.0000 [IU] | Freq: Every day | INTRAMUSCULAR | 11 refills | Status: DC

## 2021-03-25 MED ORDER — GUAIFENESIN-DM 100-10 MG/5ML PO SYRP: 5.0000 mL | ORAL_SOLUTION | ORAL | 0 refills | Status: DC | PRN

## 2021-03-25 MED ORDER — INSULIN ASPART 100 UNIT/ML IJ SOLN
0.0000 [IU] | Freq: Three times a day (TID) | INTRAMUSCULAR | Status: DC
  Administered 2021-03-26: 2 [IU] via SUBCUTANEOUS
  Administered 2021-03-26 – 2021-03-28 (×7): 3 [IU] via SUBCUTANEOUS
  Administered 2021-03-28: 2 [IU] via SUBCUTANEOUS
  Administered 2021-03-29 (×2): 3 [IU] via SUBCUTANEOUS
  Administered 2021-03-29 – 2021-03-30 (×3): 2 [IU] via SUBCUTANEOUS
  Administered 2021-03-30: 3 [IU] via SUBCUTANEOUS
  Administered 2021-03-31 (×2): 2 [IU] via SUBCUTANEOUS
  Administered 2021-03-31: 3 [IU] via SUBCUTANEOUS

## 2021-03-25 MED ORDER — LINAGLIPTIN 5 MG PO TABS
5.0000 mg | ORAL_TABLET | Freq: Every day | ORAL | Status: DC
  Administered 2021-03-26 – 2021-04-01 (×7): 5 mg via ORAL
  Filled 2021-03-25 (×7): qty 1

## 2021-03-25 MED ORDER — LEVOTHYROXINE SODIUM 88 MCG PO TABS
88.0000 ug | ORAL_TABLET | Freq: Every day | ORAL | Status: DC
Start: 2021-03-26 — End: 2021-04-01
  Administered 2021-03-26 – 2021-04-01 (×7): 88 ug via ORAL
  Filled 2021-03-25 (×7): qty 1

## 2021-03-25 MED ORDER — FLUTICASONE PROPIONATE 50 MCG/ACT NA SUSP
1.0000 | Freq: Every day | NASAL | Status: DC | PRN
  Filled 2021-03-25: qty 16

## 2021-03-25 MED ORDER — LOSARTAN POTASSIUM 50 MG PO TABS
100.0000 mg | ORAL_TABLET | Freq: Every day | ORAL | Status: DC
  Administered 2021-03-26 – 2021-04-01 (×7): 100 mg via ORAL
  Filled 2021-03-25 (×7): qty 2

## 2021-03-25 MED ORDER — CYCLOBENZAPRINE HCL 10 MG PO TABS
10.0000 mg | ORAL_TABLET | Freq: Three times a day (TID) | ORAL | Status: DC | PRN
  Administered 2021-03-25: 10 mg via ORAL
  Filled 2021-03-25: qty 1

## 2021-03-25 MED ORDER — MELATONIN 5 MG PO TABS
10.0000 mg | ORAL_TABLET | Freq: Every evening | ORAL | Status: DC | PRN
  Administered 2021-03-28 – 2021-03-30 (×3): 10 mg via ORAL
  Filled 2021-03-25 (×3): qty 2

## 2021-03-25 MED ORDER — ASPIRIN 325 MG PO TBEC: 325.0000 mg | DELAYED_RELEASE_TABLET | Freq: Every day | ORAL | 0 refills | Status: DC

## 2021-03-25 MED ORDER — ASPIRIN EC 325 MG PO TBEC
325.0000 mg | DELAYED_RELEASE_TABLET | Freq: Every day | ORAL | Status: DC
  Administered 2021-03-26 – 2021-04-01 (×7): 325 mg via ORAL
  Filled 2021-03-25 (×7): qty 1

## 2021-03-25 MED ORDER — MOMETASONE FURO-FORMOTEROL FUM 100-5 MCG/ACT IN AERO
2.0000 | INHALATION_SPRAY | Freq: Two times a day (BID) | RESPIRATORY_TRACT | Status: DC
  Administered 2021-03-25 – 2021-04-01 (×14): 2 via RESPIRATORY_TRACT
  Filled 2021-03-25: qty 8.8

## 2021-03-25 MED ORDER — IBUPROFEN 600 MG PO TABS: 600.0000 mg | ORAL_TABLET | Freq: Four times a day (QID) | ORAL | 0 refills | Status: DC | PRN

## 2021-03-25 MED ORDER — FLUOROMETHOLONE 0.1 % OP SUSP
1.0000 [drp] | Freq: Every day | OPHTHALMIC | Status: DC
  Administered 2021-03-26 – 2021-04-01 (×7): 1 [drp] via OPHTHALMIC
  Filled 2021-03-25: qty 5

## 2021-03-25 MED ORDER — CYCLOBENZAPRINE HCL 10 MG PO TABS: 10.0000 mg | ORAL_TABLET | Freq: Three times a day (TID) | ORAL | 0 refills | Status: DC | PRN

## 2021-03-25 MED ORDER — ENOXAPARIN SODIUM 60 MG/0.6ML IJ SOSY: 60.0000 mg | PREFILLED_SYRINGE | INTRAMUSCULAR | Status: DC

## 2021-03-25 MED ORDER — ACETAMINOPHEN 325 MG PO TABS
650.0000 mg | ORAL_TABLET | ORAL | Status: DC | PRN
  Administered 2021-03-25 – 2021-03-30 (×4): 650 mg via ORAL
  Filled 2021-03-25 (×4): qty 2

## 2021-03-25 MED ORDER — CLOPIDOGREL BISULFATE 75 MG PO TABS
75.0000 mg | ORAL_TABLET | Freq: Every day | ORAL | Status: DC
  Administered 2021-03-26 – 2021-04-01 (×7): 75 mg via ORAL
  Filled 2021-03-25 (×7): qty 1

## 2021-03-25 NOTE — Progress Notes (Deleted)
Izora Ribas, MD  Physician Physical Medicine and Rehabilitation PMR Pre-admission     Signed Date of Service:  03/23/2021  4:19 PM  Related encounter: ED to Hosp-Admission (Current) from 03/21/2021 in Home [facility]   Signed      Show:Clear all [x] Written[x] Templated[x] Copied  Added by: [x] Cristina Gong, RN[x] Raulkar, Clide Deutscher, MD  [] Hover for details                                                                                                                                                                                                                                                                                                                                                                                                                                     PMR Admission Coordinator Pre-Admission Assessment   Patient: Samuel Norris is an 86 y.o., male MRN: [id number] DOB: 1935-06-29 Height: 5\' 7"  (170.2 cm)Weight: 120.2 kg   Insurance Information HMO:     PPO:      PCP:      IPA:      80/20:      OTHER:  PRIMARY: [employer] Medicare   Dual Complete   Policy#: [id number]      Subscriber: pt CM Name: [place]      Phone#: [phone] option #7     Fax#: [phone] Pre-Cert#: [id number]  approved for 7 days    Employer:  Benefits:  Phone #:  [phone]     Name: 1/9 Eff. Date: 03/15/2021     Deduct: none      Out of Pocket Max: $8300      Life Max: none CIR: $1556 co pay per admission      SNF: no copay days 1 until 20; $200 co pay per days days 21 until 100 Outpatient: 80%     Co-Pay: 20% Home Health: 100%      Co-Pay: visits limited per medical neccesity DME: 80%     Co-Pay: 20% Providers: In network  SECONDARY:     Medicaid of [place]  Policy#: [id number] s active 03/23/21 [place] Counselor:        Phone#:    The Data Collection Information Summary for patients in Inpatient Rehabilitation Facilities with attached Privacy Act [place] Records was provided and verbally reviewed with: Patient   Emergency Contact Information Contact Information       Name Relation Home Work [place] Daughter [phone]               Current Medical History  Patient Admitting Diagnosis: CVA   History of Present Illness:  86 year old right-handed male with history of hyperlipidemia, hypertension, diabetes mellitus, morbid obesity, OSA nonadherent to CPAP and quit smoking 3 years ago.   Presented 03/21/2021 with headache, nausea and vomiting as well as unsteady gait.  Cranial CT scan showed focal area of low density in loss of gray-white differentiation in the right occipital lobe suspicious for infarct likely subacute.  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  No hemorrhage or mass-effect.  Diminished flow related enhancement of the left vertebral artery V4 segment consistent with high-grade stenosis otherwise normal intracranial MRA.  Admission chemistry unremarkable except glucose 254 AST 46 ALT 63, troponin 21, hemoglobin A1c 8.4.  Echocardiogram ejection fraction of 60 to 65% no wall motion abnormalities grade 1 diastolic dysfunction.  Currently maintained on aspirin 325 mg daily and Plavix 75 mg daily x3 months then Plavix alone.  Subcutaneous Lovenox for DVT prophylaxis.     Complete NIHSS TOTAL: 0   Patient's medical record from [facility] has been reviewed by the rehabilitation admission coordinator and physician.   Past Medical History      Past Medical History:  Diagnosis Date   Bilateral iliac artery aneurysm (HCC)     Diabetes mellitus without complication (HCC)     HTN (hypertension)     Hyperlipidemia     Hypothyroid     Obesity     OSA (obstructive sleep apnea)     Peripheral edema      Has the  patient had major surgery during 100 days prior to admission? No   Family History   family history includes Gout in an other family member; Stroke in his father.   Current Medications   Current Facility-Administered Medications:    acetaminophen (TYLENOL) tablet 650 mg, 650 mg, Oral, Q4H PRN, 650 mg at 03/25/21 0655 **OR** [DISCONTINUED] acetaminophen (TYLENOL) 160 MG/5ML solution 650 mg, 650 mg, Per Tube, Q4H PRN **OR** [DISCONTINUED] acetaminophen (TYLENOL) suppository 650 mg, 650 mg, Rectal, Q4H PRN, Alcario Drought, Jared M, DO   aspirin EC tablet 325 mg, 325 mg, Oral, Daily, France Ravens, MD, 325 mg at 03/25/21 0923   atorvastatin (LIPITOR) tablet 80 mg, 80 mg, Oral, QHS, Gardner, Jared M, DO, 80 mg at 03/24/21 2110   clopidogrel (PLAVIX) tablet 75 mg, 75 mg, Oral, Daily, Alcario Drought, Jared M, DO, 75  mg at 03/25/21 0924   cyclobenzaprine (FLEXERIL) tablet 10 mg, 10 mg, Oral, Q8H PRN, Dahal, Binaya, MD, 10 mg at 03/25/21 1232   enoxaparin (LOVENOX) injection 60 mg, 60 mg, Subcutaneous, Q24H, Vance Gather B, MD, 60 mg at 03/25/21 0926   fentaNYL (SUBLIMAZE) injection 25 mcg, 25 mcg, Intravenous, Q2H PRN, Etta Quill, DO, 25 mcg at 03/25/21 0925   fluorometholone (FML) 0.1 % ophthalmic suspension 1 drop, 1 drop, Both Eyes, Daily, Alcario Drought, Jared M, DO, 1 drop at 03/25/21 0923   fluticasone (FLONASE) 50 MCG/ACT nasal spray 1 spray, 1 spray, Each Nare, Daily PRN, Alcario Drought, Jared M, DO   guaiFENesin-dextromethorphan (ROBITUSSIN DM) 100-10 MG/5ML syrup 5 mL, 5 mL, Oral, Q4H PRN, Etta Quill, DO, 5 mL at 03/23/21 0307   ibuprofen (ADVIL) tablet 600 mg, 600 mg, Oral, Q6H PRN, Vance Gather B, MD, 600 mg at 03/25/21 0924   insulin aspart (novoLOG) injection 0-15 Units, 0-15 Units, Subcutaneous, TID WC, Alcario Drought, Jared M, DO, 3 Units at 03/25/21 1224   insulin aspart (novoLOG) injection 0-5 Units, 0-5 Units, Subcutaneous, QHS, Gardner, Jared M, DO, 2 Units at 03/22/21 2202   levothyroxine (SYNTHROID) tablet 88  mcg, 88 mcg, Oral, QAC breakfast, Alcario Drought, Jared M, DO, 88 mcg at 03/25/21 0617   linagliptin (TRADJENTA) tablet 5 mg, 5 mg, Oral, Daily, Vance Gather B, MD, 5 mg at 03/25/21 0924   loratadine (CLARITIN) tablet 10 mg, 10 mg, Oral, Daily PRN, Alcario Drought, Jared M, DO   losartan (COZAAR) tablet 100 mg, 100 mg, Oral, Daily, Vance Gather B, MD, 100 mg at 03/25/21 1610   melatonin tablet 10 mg, 10 mg, Oral, QHS PRN, Kristopher Oppenheim, DO, 10 mg at 03/24/21 2110   mometasone-formoterol (DULERA) 100-5 MCG/ACT inhaler 2 puff, 2 puff, Inhalation, BID, Etta Quill, DO, 2 puff at 03/25/21 0922   pantoprazole (PROTONIX) EC tablet 40 mg, 40 mg, Oral, Daily, Alcario Drought, Jared M, DO, 40 mg at 03/25/21 9604   phenol (CHLORASEPTIC) mouth spray 1 spray, 1 spray, Mouth/Throat, PRN, Etta Quill, DO, 1 spray at 03/23/21 0307   Patients Current Diet:  Diet Order                  Diet Carb Modified Fluid consistency: Thin; Room service appropriate? Yes  Diet effective now                       Precautions / Restrictions Precautions Precautions: Fall Restrictions Weight Bearing Restrictions: No    Has the patient had 2 or more falls or a fall with injury in the past year? No   Prior Activity Level Limited Community (1-2x/wk): Independent and driving   Prior Functional Level Self Care: Did the patient need help bathing, dressing, using the toilet or eating? Independent   Indoor Mobility: Did the patient need assistance with walking from room to room (with or without device)? Independent   Stairs: Did the patient need assistance with internal or external stairs (with or without device)? Independent   Functional Cognition: Did the patient need help planning regular tasks such as shopping or remembering to take medications? Independent   Patient Information Do you need or want an interpreter to communicate with a doctor or health care staff?: 1. Yes   Patient's Response To:  Health Literacy and  Transportation Is the patient able to respond to health literacy and transportation needs?: Yes Health Literacy - How often do you need to have someone help you  when you read instructions, pamphlets, or other written material from your doctor or pharmacy?: Never In the past 12 months, has lack of transportation kept you from medical appointments or from getting medications?: No In the past 12 months, has lack of transportation kept you from meetings, work, or from getting things needed for daily living?: No   Home Assistive Devices / [place] Devices/Equipment: Environmental consultant (specify type) Home Equipment: Conservation officer, nature (2 wheels), Shower seat   Prior Device Use: Indicate devices/aids used by the patient prior to current illness, exacerbation or injury?  Uses RW as needed   Current Functional Level Cognition   Overall Cognitive Status: Within Functional Limits for tasks assessed Orientation Level: Oriented X4    Extremity Assessment (includes Sensation/Coordination)   Upper Extremity Assessment: Defer to OT evaluation  Lower Extremity Assessment: RLE deficits/detail, LLE deficits/detail RLE Deficits / Details: strength 5/5 LLE Deficits / Details: strength 5/5     ADLs   Overall ADL's : Needs assistance/impaired Eating/Feeding: Set up, Sitting Grooming: Set up, Sitting Upper Body Bathing: Minimal assistance, Sitting Lower Body Bathing: Maximal assistance, Sitting/lateral leans, Sit to/from stand Upper Body Dressing : Min guard, Sitting Lower Body Dressing: Maximal assistance, Sitting/lateral leans, Sit to/from stand Toilet Transfer: Minimal assistance, Stand-pivot Toileting- Clothing Manipulation and Hygiene: Moderate assistance, Sitting/lateral lean, Sit to/from stand Functional mobility during ADLs: Minimal assistance, Rolling walker (2 wheels) General ADL Comments: PT limtied by dizziness and poor balance.     Mobility   Overal bed mobility: Needs Assistance Bed  Mobility: Sit to Supine Supine to sit: Supervision Sit to supine: Min assist General bed mobility comments: up in recliner     Transfers   Overall transfer level: Needs assistance Equipment used: Rolling walker (2 wheels) Transfers: Sit to/from Stand Sit to Stand: Min guard General transfer comment: min guard to rise and steady, pt using momentum to power up     Ambulation / Gait / Stairs / Wheelchair Mobility   Ambulation/Gait Ambulation/Gait assistance: Herbalist (Feet): 120 Feet (& 100') Assistive device: Rolling walker (2 wheels) Gait Pattern/deviations: Step-through pattern, Decreased stride length, Drifts right/left General Gait Details: min A for balance/safety, stopped to rest and check HR and reposition heart monitor HR 67. Gait velocity: decreased Stairs: Yes Stairs assistance: Min assist Stair Management: Alternating pattern, Forwards, Two rails, Step to pattern Number of Stairs: 3 General stair comments: assist for balance/safety     Posture / Balance Balance Overall balance assessment: Needs assistance Sitting-balance support: Feet supported Sitting balance-Leahy Scale: Good Standing balance support: Reliant on assistive device for balance Standing balance-Leahy Scale: Poor     Special needs/care consideration Hgb A1c 8.4 Smoking cessation advised Spanish Interpreter required Spanish CIR booklets provided    Previous Home Environment  Living Arrangements: Children  Lives With: Daughter Available Help at Discharge:  (daughter works 8 am until 10 pm daily) Type of Home: House Home Layout: Two level Alternate Level Stairs-Rails: Left Alternate Level Stairs-Number of Steps: 15 stairs Home Access: Stairs to enter Entrance Stairs-Rails: None Entrance Stairs-Number of Steps: 2 steps Bathroom Shower/Tub: Chiropodist: Standard Bathroom Accessibility: Yes How Accessible: Accessible via walker Home Care Services: No    Discharge Living Setting Plans for Discharge Living Setting: Patient's home, House, Lives with (comment) (daughter) Type of Home at Discharge: House Discharge Home Layout: Two level Alternate Level Stairs-Rails: Left Alternate Level Stairs-Number of Steps: 15 Discharge Home Access: Stairs to enter Entrance Stairs-Rails: None Entrance Stairs-Number of Steps: 2 Discharge  Bathroom Shower/Tub: Designer, fashion/clothing: Standard Discharge Bathroom Accessibility: Yes How Accessible: Accessible via walker Does the patient have any problems obtaining your medications?: No   Social/Family/Support Systems Patient Roles: Parent Contact Information: daughter, Denman George Anticipated Caregiver: daughter Anticipated Caregiver's Contact Information: see contacts Ability/Limitations of Caregiver: Yolanda works 8 am until 10 pm daily Caregiver Availability: Evenings only Discharge Plan Discussed with Primary Caregiver: Yes Is Caregiver In Agreement with Plan?: Yes Does Caregiver/Family have Issues with Lodging/Transportation while Pt is in Rehab?: No   He is divorced for 20 years. Lives with daughter and his [place].   Goals Patient/Family Goal for Rehab: Mod I to intermittent supervision with PT and OT Expected length of stay: ELOS 7 to 10 days Cultural Considerations: Spanish interpreter needed Pt/Family Agrees to Admission and willing to participate: Yes Program Orientation Provided & Reviewed with Pt/Caregiver Including Roles  & Responsibilities: Yes   Decrease burden of Care through IP rehab admission: n/a   Possible need for SNF placement upon discharge: not anticipated   Patient Condition: I have reviewed medical records from [facility], spoken with CM, and patient. I met with patient at the bedside for inpatient rehabilitation assessment.  Patient will benefit from ongoing PT and OT, can actively participate in 3 hours of therapy a day 5 days of the week,  and can make measurable gains during the admission.  Patient will also benefit from the coordinated team approach during an Inpatient Acute Rehabilitation admission.  The patient will receive intensive therapy as well as Rehabilitation physician, nursing, social worker, and care management interventions.  Due to bladder management, bowel management, safety, skin/wound care, disease management, medication administration, pain management, and patient education the patient requires 24 hour a day rehabilitation nursing.  The patient is currently min assist overall with mobility and basic ADLs.  Discharge setting and therapy post discharge at home with home health is anticipated.  Patient has agreed to participate in the Acute Inpatient Rehabilitation Program and will admit today.   Preadmission Screen Completed By:  Cleatrice Burke, 03/25/2021 3:15 PM ______________________________________________________________________   Discussed status with Dr. Ranell Patrick on 03/25/2021 at 1516 and received approval for admission today.   Admission Coordinator:  Cleatrice Burke, RN, time  8315 Date  03/25/2021    Assessment/Plan: Diagnosis: CVA Does the need for close, 24 hr/day Medical supervision in concert with the patient's rehab needs make it unreasonable for this patient to be served in a less intensive setting? Yes Co-Morbidities requiring supervision/potential complications: morbid obesity, DM2, HTN, HLD, OSA Due to bladder management, bowel management, safety, skin/wound care, disease management, medication administration, pain management, and patient education, does the patient require 24 hr/day rehab nursing? Yes Does the patient require coordinated care of a physician, rehab nurse, PT, OT, and SLP to address physical and functional deficits in the context of the above medical diagnosis(es)? Yes Addressing deficits in the following areas: balance, endurance, locomotion, strength, transferring,  bowel/bladder control, bathing, dressing, feeding, grooming, toileting, and psychosocial support Can the patient actively participate in an intensive therapy program of at least 3 hrs of therapy 5 days a week? Yes The potential for patient to make measurable gains while on inpatient rehab is excellent Anticipated functional outcomes upon discharge from inpatient rehab: modified independent PT, modified independent OT, independent SLP Estimated rehab length of stay to reach the above functional goals is: 10-14 days Anticipated discharge destination: Home 10. Overall Rehab/Functional Prognosis: excellent     MD Signature: Leeroy Cha,  MD         Revision History                                    Note Details  Author Izora Ribas, MD File Time 03/25/2021  3:19 PM  Author Type Physician Status Signed  Last Editor Izora Ribas, MD Service Physical Medicine and Rehabilitation

## 2021-03-25 NOTE — Discharge Summary (Signed)
Physician Discharge Summary  Greg Cratty WUJ:[id number] DOB: 1935/09/04 DOA: 03/21/2021  PCP: System, Provider Not In  Admit date: 03/21/2021 Discharge date: 03/25/2021  Admitted From: Home Discharge disposition: CIR   Code Status: Full Code  Diet Recommendation: Cardiac/diabetic diet  Discharge Diagnosis:   Principal Problem:   Acute ischemic stroke (HCC) Active Problems:   DM2 (diabetes mellitus, type 2) (HCC)   HTN (hypertension)   HLD (hyperlipidemia)   OSA (obstructive sleep apnea)   Hypothyroidism   Smoking   COPD (chronic obstructive pulmonary disease) (HCC)   History of Present Illness / Brief narrative:  Samuel Norris is an 86 y.o. 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 He was admitted for stroke evaluation and work up. DAPT has been added, high-intensity statin has been continued, though his symptoms remain. PT and OT have recommended inpatient rehabilitation.  Subjective:  Seen and examined this morning.  Pleasant elderly Hispanic male.  Sitting up in chair.  Not in distress.  Complains of intermittent low back pain because of prolonged immobility while in the hospital.  Hospital Course:  Acute left cerebellar (PICA) stroke:  -Likely related to V4 vertebral artery occlusion. Also has chronic right PCA/PCA infarct. Echocardiogram shows no cardioembolic source.  - Etiology likely large vessel disease, left VA occlusion. Neurology recommends DAPT x3 months followed by monotherapy with plavix.  - LDL 72 > continue high intensity statin (atorvastatin 80mg ) - Smoking cessation, weight loss, and improved glycemic control all recommended as well.  - Follow up at [facility] in 4 weeks. - CIR disposition is  appropriate and being pursued.  - Headache is likely due to associated cytotoxic cerebellar edema noted on CT. Tylenol and/or ibuprofen prn headache. Improving.  Type 2 diabetes mellitus -A1c 8.4 on 03/22/2021 -Home meds include Januvia, metformin -Resume the same post discharge. -Continue sliding scale insulin with Accu-Cheks Recent Labs  Lab 03/24/21 1152 03/24/21 1616 03/24/21 2040 03/25/21 0617 03/25/21 1144  GLUCAP 183* 162* 182* 158* 191*   Hyperlipidemia -Statin  Essential hypertension   -losartan   Morbid obesity  -Body mass index is 41.5 kg/m. Patient has been advised to make an attempt to improve diet and exercise patterns to aid in weight loss.  OSA -CPAP at discharge, though pt has not used this.   COPD -Continue bronchodilators    Hypothyroidism:  -Continue synthroid   GERD:  -PPI    Discharge Medications:   Allergies as of 03/25/2021   No Known Allergies      Medication List     STOP taking these medications    diclofenac 75 MG EC tablet Commonly known as: VOLTAREN   furosemide 20 MG tablet Commonly known as: LASIX       TAKE these medications    acetaminophen 500 MG tablet Commonly known as: TYLENOL Take 1,000 mg by mouth every 6 (six) hours as needed for moderate pain or headache.   aspirin 325 MG EC tablet Take 1 tablet (325 mg total) by mouth daily. Start taking on: March 26, 2021 What changed:  medication strength how much to take additional instructions   atorvastatin 80 MG tablet Commonly known as: LIPITOR Take 80 mg by mouth at bedtime.   budesonide-formoterol 80-4.5 MCG/ACT inhaler Commonly known as: SYMBICORT Inhale 2 puffs  into the lungs 2 (two) times daily.   clopidogrel 75 MG tablet Commonly known as: PLAVIX Take 1 tablet (75 mg total) by mouth daily. Start taking on: March 26, 2021   cyclobenzaprine 10 MG tablet Commonly known as: FLEXERIL Take 1 tablet (10 mg total) by mouth every 8 (eight) hours  as needed for muscle spasms.   fluorometholone 0.1 % ophthalmic suspension Commonly known as: FML Place 1 drop into both eyes daily.   fluticasone 50 MCG/ACT nasal spray Commonly known as: FLONASE Place 1 spray into both nostrils daily as needed for allergies.   guaiFENesin-dextromethorphan 100-10 MG/5ML syrup Commonly known as: ROBITUSSIN DM Take 5 mLs by mouth every 4 (four) hours as needed for cough (chest congestion).   ibuprofen 600 MG tablet Commonly known as: ADVIL Take 1 tablet (600 mg total) by mouth every 6 (six) hours as needed for headache.   insulin aspart 100 UNIT/ML injection Commonly known as: novoLOG Inject 0-15 Units into the skin 3 (three) times daily with meals.   insulin aspart 100 UNIT/ML injection Commonly known as: novoLOG Inject 0-5 Units into the skin at bedtime.   levothyroxine 88 MCG tablet Commonly known as: SYNTHROID Take 88 mcg by mouth daily.   loratadine 10 MG tablet Commonly known as: CLARITIN Take 10 mg by mouth daily as needed for allergies.   losartan 100 MG tablet Commonly known as: COZAAR Take 100 mg by mouth daily.   Melatonin 10 MG Tabs Take 10 mg by mouth at bedtime as needed.   metFORMIN 500 MG tablet Commonly known as: GLUCOPHAGE Take 500 mg by mouth 2 (two) times daily.   omeprazole 20 MG capsule Commonly known as: PRILOSEC Take 40 mg by mouth daily.   potassium chloride 10 MEQ tablet Commonly known as: KLOR-CON Take 10 mEq by mouth daily.   sitaGLIPtin 100 MG tablet Commonly known as: JANUVIA Take 100 mg by mouth daily.        Wound care:     Discharge Instructions:   Discharge Instructions     Ambulatory referral to Neurology   Complete by: As directed    Follow up with stroke clinic NP (Jessica Vanschaick or Darrol Angel, if both not available, consider Manson Allan, or Ahern) at [facility] in about 4 weeks. Thanks.   Call MD for:  difficulty breathing, headache or visual disturbances   Complete by:  As directed    Call MD for:  extreme fatigue   Complete by: As directed    Call MD for:  hives   Complete by: As directed    Call MD for:  persistant dizziness or light-headedness   Complete by: As directed    Call MD for:  persistant nausea and vomiting   Complete by: As directed    Call MD for:  severe uncontrolled pain   Complete by: As directed    Call MD for:  temperature >100.4   Complete by: As directed    Diet Carb Modified   Complete by: As directed    Diet general   Complete by: As directed    Discharge instructions   Complete by: As directed    Discharge instructions for diabetes mellitus: Check blood sugar 3 times a day and bedtime at home. If blood sugar running above 200 or less than 70 please call your MD to adjust insulin. If you notice signs and symptoms of hypoglycemia (low blood sugar) like jitteriness, confusion, thirst, tremor and sweating, please check blood sugar, drink sugary drink/biscuits/sweets  to increase sugar level and call MD or return to ER.    General discharge instructions:  Follow with Primary MD System, Provider Not In in 7 days   Get CBC/BMP checked in next visit within 1 week by PCP or SNF MD. (We routinely change or add medications that can affect your baseline labs and fluid status, therefore we recommend that you get the mentioned basic workup next visit with your PCP, your PCP may decide not to get them or add new tests based on their clinical decision)  On your next visit with your PCP, please get your medicines reviewed and adjusted.  Please request your PCP  to go over all hospital tests, procedures, radiology results at the follow up, please get all Hospital records sent to your PCP by signing hospital release before you go home.  Activity: As tolerated with Full fall precautions use walker/cane & assistance as needed  Avoid using any recreational substances like cigarette, tobacco, alcohol, or non-prescribed drug.  If you  experience worsening of your admission symptoms, develop shortness of breath, life threatening emergency, suicidal or homicidal thoughts you must seek medical attention immediately by calling 911 or calling your MD immediately  if symptoms less severe.  You must read complete instructions/literature along with all the possible adverse reactions/side effects for all the medicines you take and that have been prescribed to you. Take any new medicine only after you have completely understood and accepted all the possible adverse reactions/side effects.   Do not drive, operate heavy machinery, perform activities at heights, swimming or participation in water activities or provide baby sitting services if your were admitted for syncope or siezures until you have seen by Primary MD or a Neurologist and advised to do so again.  Do not drive when taking Pain medications.  Do not take more than prescribed Pain, Sleep and Anxiety Medications  Wear Seat belts while driving.  Please note You were cared for by a hospitalist during your hospital stay. If you have any questions about your discharge medications or the care you received while you were in the hospital after you are discharged, you can call the unit and asked to speak with the hospitalist on call if the hospitalist that took care of you is not available. Once you are discharged, your primary care physician will handle any further medical issues. Please note that NO REFILLS for any discharge medications will be authorized once you are discharged, as it is imperative that you return to your primary care physician (or establish a relationship with a primary care physician if you do not have one) for your aftercare needs so that they can reassess your need for medications and monitor your lab values.   Increase activity slowly   Complete by: As directed        Follow ups:    Follow-up Information     [facility]. Schedule an  appointment as soon as possible for a visit in 1 month(s).   Specialty: Neurology Why: stroke clinic Contact information: [address] [place] [address] [phone]                Discharge Exam:   Vitals:   03/25/21 0000 03/25/21 0342 03/25/21 0815 03/25/21 1140  BP: (!) 155/71 (!) 145/66 (!) 115/59 102/75  Pulse: (!) 55 (!) 52 65 (!) 54  Resp: 20 19 19 19   Temp: 99.2 F (37.3 C) 98 F (36.7 C) 98.3 F (36.8 C) 97.6  F (36.4 C)  TempSrc: Oral  Oral Oral  SpO2: 95% 92% 96% 96%  Weight:      Height:        Body mass index is 41.5 kg/m.  General exam: Pleasant, elderly Hispanic male.  Not in distress Skin: No rashes, lesions or ulcers. HEENT: Atraumatic, normocephalic, no obvious bleeding Lungs: Clear to auscultation bilaterally CVS: Regular rate and rhythm, no murmur GI/Abd soft, nontender, nondistended, some present CNS: Alert, awake, oriented x3 Psychiatry: Mood appropriate Extremities: No pedal edema, no calf tenderness  Time coordinating discharge: 35 minutes   The results of significant diagnostics from this hospitalization (including imaging, microbiology, ancillary and laboratory) are listed below for reference.    Procedures and Diagnostic Studies:   CT ANGIO HEAD NECK W WO CM  Result Date: 03/22/2021 CLINICAL DATA:  86 year old male with neurologic deficit. Acute left cerebellar infarct, abnormal distal left vertebral artery. EXAM: CT ANGIOGRAPHY HEAD AND NECK TECHNIQUE: Multidetector CT imaging of the head and neck was performed using the standard protocol during bolus administration of intravenous contrast. Multiplanar CT image reconstructions and MIPs were obtained to evaluate the vascular anatomy. Carotid stenosis measurements (when applicable) are obtained utilizing NASCET criteria, using the distal internal carotid diameter as the denominator. CONTRAST:  57mL OMNIPAQUE IOHEXOL 350 MG/ML SOLN COMPARISON:  Brain MRI and  intracranial MRA yesterday. Head CT yesterday. FINDINGS: CT HEAD Brain: Cytotoxic edema now visible in the medial left cerebellum. No associated hemorrhage. No posterior fossa mass effect. Superimposed small chronic left cerebellar infarct. Chronic encephalomalacia medial right PCA territory. Stable gray-white matter differentiation elsewhere. No midline shift, ventriculomegaly, mass effect, evidence of mass lesion, intracranial hemorrhage or new cortically based acute infarction. Calvarium and skull base: Negative. Paranasal sinuses: Visualized paranasal sinuses and mastoids are stable and well aerated. Orbits: No acute orbit or scalp soft tissue finding. CTA NECK Skeleton: Absent dentition. Multilevel cervical spine degeneration. No acute osseous abnormality identified. Upper chest: Superior mediastinal lipomatosis. No superior mediastinal lymphadenopathy. Mild mosaic attenuation in the upper lungs likely mild atelectasis and gas trapping. Atelectatic changes to the trachea at the thoracic inlet. Other neck: Retropharyngeal course of both carotid arteries (normal variant). No acute neck soft tissue finding. Aortic arch: 3 vessel arch configuration. Mild calcified arch atherosclerosis. Right carotid system: Tortuous brachiocephalic artery and proximal right CCA without plaque or stenosis. Retropharyngeal course of the right carotid. Minor calcified plaque at the posterior right ICA origin without stenosis. Tortuous right ICA distal to the bulb. Left carotid system: Tortuous proximal left CCA and retropharyngeal course without stenosis. Negative left carotid bifurcation. No stenosis. Vertebral arteries: Calcified plaque and tortuosity in the proximal right subclavian artery without hemodynamically significant stenosis. Normal right vertebral artery origin. Right vertebral artery is patent and normal to the skull base. Proximal left subclavian artery is patent with mild ectasia and minimal atherosclerosis. Left  vertebral artery is occluded just distal to its origin in the V1 segment (series 18, image 534). No left vertebral artery reconstitution to the skull base. CTA HEAD Posterior circulation: Right vertebral V4 segment is patent to the basilar without stenosis. Right AICA appears dominant. There is partial retrograde reconstitution of the left vertebral V4 segment and the left PICA origin remains patent. Patent basilar artery without stenosis. AICA, SCA and PCA origins are patent. Posterior communicating arteries are diminutive or absent. Bilateral PCA branches are patent, but there is a short segment mild to moderate stenosis of the right P2 on series 14, image 27. Anterior circulation: Both ICA  siphons are patent and mildly tortuous. Minimal siphon plaque. No ICA siphon stenosis. Patent carotid termini, MCA and ACA origins with mild tortuosity. Diminutive or absent anterior communicating artery. Bilateral MCA and ACA branches are patent with mild tortuosity and no significant stenosis. Venous sinuses: Patent. Anatomic variants: None. Review of the MIP images confirms the above findings IMPRESSION: 1. Positive for occluded Left Vertebral Artery just beyond its origin. Only partial reconstituted V4 segment but the left PICA origin appears to remain patent. 2. Mild to moderate right PCA P2 segment stenosis. But no other significant arterial stenosis in the head or neck. Generalized arterial tortuosity. Retrograde course of the carotids. 3. Expected CT appearance of the Left cerebellar infarct. No associated hemorrhage or mass effect. 4. No new intracranial abnormality. These results were communicated to Dr. Roda ShuttersXu at 10:31 am on 03/22/2021 by text page via the [facility] messaging system. Electronically Signed   By: Odessa FlemingH  Hall M.D.   On: 03/22/2021 10:35   CT HEAD WO CONTRAST  Result Date: 03/21/2021 CLINICAL DATA:  Neuro deficit, acute, stroke suspected Patient reports sudden onset of headache and dizziness. EXAM: CT HEAD WITHOUT  CONTRAST TECHNIQUE: Contiguous axial images were obtained from the base of the skull through the vertex without intravenous contrast. COMPARISON:  None. FINDINGS: Brain: Focal area of low-density and loss of gray-white differentiation in the right occipital lobe, series 3, image 18 and series 5, image 49. No acute intracranial hemorrhage. Generalized atrophy, normal for age. Mild periventricular white matter hypodensity typical of chronic small vessel ischemia. No subdural or extra-axial collection. No hydrocephalus. Basilar cisterns are patent. Vascular: No hyperdense vessel or unexpected calcification. Skull: No fracture or focal lesion. Sinuses/Orbits: Trace mucosal thickening of maxillary sinuses. No sinus fluid levels. No mastoid effusion. Bilateral cataract resection. Other: Small left frontal scalp lipoma. IMPRESSION: 1. Focal area of low-density and loss of gray-white differentiation in the right occipital lobe, suspicious for infarct, likely subacute. Consider further evaluation with MRI. 2. Age related atrophy and chronic small vessel ischemia. Electronically Signed   By: Narda RutherfordMelanie  Sanford M.D.   On: 03/21/2021 17:19   MR ANGIO HEAD WO CONTRAST  Result Date: 03/21/2021 CLINICAL DATA:  Acute neurologic deficit EXAM: MRI HEAD WITHOUT CONTRAST MRA HEAD WITHOUT CONTRAST TECHNIQUE: Multiplanar, multi-echo pulse sequences of the brain and surrounding structures were acquired without intravenous contrast. Angiographic images of the Circle of Willis were acquired using MRA technique without intravenous contrast. COMPARISON:  No pertinent prior exam. FINDINGS: MRI HEAD FINDINGS Brain: Small area of acute ischemia involving the inferior medial left cerebellar hemisphere. No acute or chronic hemorrhage. Normal white matter signal. Generalized volume loss without a clear lobar predilection. The midline structures are normal. Skull and upper cervical spine: Normal calvarium and skull base. Visualized upper cervical  spine and soft tissues are normal. Sinuses/Orbits:No paranasal sinus fluid levels or advanced mucosal thickening. No mastoid or middle ear effusion. Normal orbits. MRA HEAD FINDINGS POSTERIOR CIRCULATION: --Vertebral arteries: Diminished flow related enhancement of the left vertebral artery V4 segment. Normal right V4. --Inferior cerebellar arteries: Normal. --Basilar artery: Normal. --Superior cerebellar arteries: Normal. --Posterior cerebral arteries: Normal. ANTERIOR CIRCULATION: --Intracranial internal carotid arteries: Normal. --Anterior cerebral arteries (ACA): Normal. --Middle cerebral arteries (MCA): Normal. ANATOMIC VARIANTS: None IMPRESSION: 1. Small acute infarct of the inferior medial left cerebellar hemisphere. No hemorrhage or mass effect. 2. Diminished flow related enhancement of the left vertebral artery V4 segment, consistent with high-grade stenosis. 3. Otherwise normal intracranial MRA. Electronically Signed   By: Caryn BeeKevin  Chase PicketHerman M.D.   On: 03/21/2021 21:26   MR BRAIN WO CONTRAST  Result Date: 03/21/2021 CLINICAL DATA:  Acute neurologic deficit EXAM: MRI HEAD WITHOUT CONTRAST MRA HEAD WITHOUT CONTRAST TECHNIQUE: Multiplanar, multi-echo pulse sequences of the brain and surrounding structures were acquired without intravenous contrast. Angiographic images of the Circle of Willis were acquired using MRA technique without intravenous contrast. COMPARISON:  No pertinent prior exam. FINDINGS: MRI HEAD FINDINGS Brain: Small area of acute ischemia involving the inferior medial left cerebellar hemisphere. No acute or chronic hemorrhage. Normal white matter signal. Generalized volume loss without a clear lobar predilection. The midline structures are normal. Skull and upper cervical spine: Normal calvarium and skull base. Visualized upper cervical spine and soft tissues are normal. Sinuses/Orbits:No paranasal sinus fluid levels or advanced mucosal thickening. No mastoid or middle ear effusion. Normal  orbits. MRA HEAD FINDINGS POSTERIOR CIRCULATION: --Vertebral arteries: Diminished flow related enhancement of the left vertebral artery V4 segment. Normal right V4. --Inferior cerebellar arteries: Normal. --Basilar artery: Normal. --Superior cerebellar arteries: Normal. --Posterior cerebral arteries: Normal. ANTERIOR CIRCULATION: --Intracranial internal carotid arteries: Normal. --Anterior cerebral arteries (ACA): Normal. --Middle cerebral arteries (MCA): Normal. ANATOMIC VARIANTS: None IMPRESSION: 1. Small acute infarct of the inferior medial left cerebellar hemisphere. No hemorrhage or mass effect. 2. Diminished flow related enhancement of the left vertebral artery V4 segment, consistent with high-grade stenosis. 3. Otherwise normal intracranial MRA. Electronically Signed   By: Deatra RobinsonKevin  Herman M.D.   On: 03/21/2021 21:26     Labs:   Basic Metabolic Panel: Recent Labs  Lab 03/21/21 1635 03/21/21 1655  NA 135 135  K 4.0 3.9  CL 99 98  CO2 26  --   GLUCOSE 254* 256*  BUN 9 11  CREATININE 0.78 0.60*  CALCIUM 9.1  --    GFR Estimated Creatinine Clearance: 86.8 mL/min (A) (by C-G formula based on SCr of 0.6 mg/dL (L)). Liver Function Tests: Recent Labs  Lab 03/21/21 1635  AST 46*  ALT 63*  ALKPHOS 89  BILITOT 0.8  PROT 7.1  ALBUMIN 3.4*   No results for input(s): LIPASE, AMYLASE in the last 168 hours. No results for input(s): AMMONIA in the last 168 hours. Coagulation profile Recent Labs  Lab 03/21/21 1635  INR 1.0    CBC: Recent Labs  Lab 03/21/21 1635 03/21/21 1655  WBC 10.5  --   NEUTROABS 8.6*  --   HGB 15.2 16.7  HCT 46.5 49.0  MCV 91.7  --   PLT 192  --    Cardiac Enzymes: No results for input(s): CKTOTAL, CKMB, CKMBINDEX, TROPONINI in the last 168 hours. BNP: Invalid input(s): POCBNP CBG: Recent Labs  Lab 03/24/21 1152 03/24/21 1616 03/24/21 2040 03/25/21 0617 03/25/21 1144  GLUCAP 183* 162* 182* 158* 191*   D-Dimer No results for input(s): DDIMER  in the last 72 hours. Hgb A1c No results for input(s): HGBA1C in the last 72 hours. Lipid Profile No results for input(s): CHOL, HDL, LDLCALC, TRIG, CHOLHDL, LDLDIRECT in the last 72 hours. Thyroid function studies No results for input(s): TSH, T4TOTAL, T3FREE, THYROIDAB in the last 72 hours.  Invalid input(s): FREET3 Anemia work up No results for input(s): VITAMINB12, FOLATE, FERRITIN, TIBC, IRON, RETICCTPCT in the last 72 hours. Microbiology Recent Results (from the past 240 hour(s))  Resp Panel by RT-PCR (Flu A&B, Covid) Nasopharyngeal Swab     Status: None   Collection Time: 03/22/21  5:39 AM   Specimen: Nasopharyngeal Swab; Nasopharyngeal(NP) swabs in vial transport medium  Result  Value Ref Range Status   SARS Coronavirus 2 by RT PCR NEGATIVE NEGATIVE Final    Comment: (NOTE) SARS-CoV-2 target nucleic acids are NOT DETECTED.  The SARS-CoV-2 RNA is generally detectable in upper respiratory specimens during the acute phase of infection. The lowest concentration of SARS-CoV-2 viral copies this assay can detect is 138 copies/mL. A negative result does not preclude SARS-Cov-2 infection and should not be used as the sole basis for treatment or other patient management decisions. A negative result may occur with  improper specimen collection/handling, submission of specimen other than nasopharyngeal swab, presence of viral mutation(s) within the areas targeted by this assay, and inadequate number of viral copies(<138 copies/mL). A negative result must be combined with clinical observations, patient history, and epidemiological information. The expected result is Negative.  Fact Sheet for Patients:  BloggerCourse.com  Fact Sheet for Healthcare Providers:  SeriousBroker.it  This test is no t yet approved or cleared by the [place] FDA and  has been authorized for detection and/or diagnosis of SARS-CoV-2 by FDA under an  Emergency Use Authorization (EUA). This EUA will remain  in effect (meaning this test can be used) for the duration of the COVID-19 declaration under Section 564(b)(1) of the Act, 21 U.S.C.section 360bbb-3(b)(1), unless the authorization is terminated  or revoked sooner.       Influenza A by PCR NEGATIVE NEGATIVE Final   Influenza B by PCR NEGATIVE NEGATIVE Final    Comment: (NOTE) The Xpert Xpress SARS-CoV-2/FLU/RSV plus assay is intended as an aid in the diagnosis of influenza from Nasopharyngeal swab specimens and should not be used as a sole basis for treatment. Nasal washings and aspirates are unacceptable for Xpert Xpress SARS-CoV-2/FLU/RSV testing.  Fact Sheet for Patients: BloggerCourse.com  Fact Sheet for Healthcare Providers: SeriousBroker.it  This test is not yet approved or cleared by the [place] FDA and has been authorized for detection and/or diagnosis of SARS-CoV-2 by FDA under an Emergency Use Authorization (EUA). This EUA will remain in effect (meaning this test can be used) for the duration of the COVID-19 declaration under Section 564(b)(1) of the Act, 21 U.S.C. section 360bbb-3(b)(1), unless the authorization is terminated or revoked.  Performed at [facility], [address] [place], [place] [address]      Signed: Lorin Glass  [facility] 03/25/2021, 3:23 PM

## 2021-03-25 NOTE — Progress Notes (Signed)
Physical Therapy Treatment Patient Details Name: Samuel Norris MRN: [id number] DOB: [birth year] Today's Date: 03/25/2021   History of Present Illness 86 y.o. M admitted on 03/21/21 due to unsteadiness, left posterior headache and vomiting. MRI showed inferior/medial left cerebellum stroke. PMH significant for uncontrolled T2DM, HTN, HLD, morbid obesity, OSA nonadherent to CPAP, smoking.    PT Comments    Patient ambulating further, but with increased fatigue and worsening gait abnormality with fatigue.  Standing balance tasks aggravated back pain.  He will benefit from further skilled PT in the acute inpatient rehab setting prior to d/c home.   Seen with video interpreter Gladys during session.   Recommendations for follow up therapy are one component of a multi-disciplinary discharge planning process, led by the attending physician.  Recommendations may be updated based on patient status, additional functional criteria and insurance authorization.  Follow Up Recommendations  Acute inpatient rehab (3hours/day)     Assistance Recommended at Discharge PRN  Patient can return home with the following A little help with walking and/or transfers;A little help with bathing/dressing/bathroom;Assist for transportation;Help with stairs or ramp for entrance   Equipment Recommendations  None recommended by PT    Recommendations for Other Services       Precautions / Restrictions Precautions Precautions: Fall     Mobility  Bed Mobility               General bed mobility comments: up in recliner    Transfers Overall transfer level: Needs assistance Equipment used: Rolling walker (2 wheels) Transfers: Sit to/from Stand Sit to Stand: Min assist           General transfer comment: assist for balance    Ambulation/Gait Ambulation/Gait assistance: Min assist Gait Distance (Feet): 225 Feet Assistive device: Rolling walker (2 wheels) Gait Pattern/deviations: Step-through  pattern;Decreased stride length;Wide base of support;Trunk flexed;Shuffle       General Gait Details: increasing BOS with ambulation and flexed posture   Stairs             Wheelchair Mobility    Modified Rankin (Stroke Patients Only) Modified Rankin (Stroke Patients Only) Pre-Morbid Rankin Score: No significant disability Modified Rankin: Moderately severe disability     Balance Overall balance assessment: Needs assistance Sitting-balance support: Feet supported Sitting balance-Leahy Scale: Good     Standing balance support: Reliant on assistive device for balance Standing balance-Leahy Scale: Poor Standing balance comment: able to balance for about 20 sec x 2 without UE support with close S, marching in place with UE support x 5 each till c/o back pain                            Cognition Arousal/Alertness: Awake/alert Behavior During Therapy: WFL for tasks assessed/performed Overall Cognitive Status: Within Functional Limits for tasks assessed                                          Exercises      General Comments General comments (skin integrity, edema, etc.): in bathroom upon PT entry.  CIR admissions coordinator in to deliver message that she had received approval for admission today.      Pertinent Vitals/Pain Pain Assessment: No/denies pain (had back pain this morning)    Home Living  Prior Function            PT Goals (current goals can now be found in the care plan section) Progress towards PT goals: Progressing toward goals    Frequency    Min 4X/week      PT Plan Current plan remains appropriate    Co-evaluation              AM-PAC PT "6 Clicks" Mobility   Outcome Measure  Help needed turning from your back to your side while in a flat bed without using bedrails?: A Lot Help needed moving from lying on your back to sitting on the side of a flat bed without  using bedrails?: A Little Help needed moving to and from a bed to a chair (including a wheelchair)?: A Little Help needed standing up from a chair using your arms (e.g., wheelchair or bedside chair)?: A Little Help needed to walk in hospital room?: A Little Help needed climbing 3-5 steps with a railing? : A Lot 6 Click Score: 16    End of Session Equipment Utilized During Treatment: Gait belt Activity Tolerance: Patient limited by fatigue;Patient limited by pain Patient left: in chair;with call bell/phone within reach   PT Visit Diagnosis: Other abnormalities of gait and mobility (R26.89);Muscle weakness (generalized) (M62.81)     Time: 9518-8416 PT Time Calculation (min) (ACUTE ONLY): 37 min  Charges:  $Gait Training: 8-22 mins $Neuromuscular Re-education: 8-22 mins                     Sheran Lawless, PT Acute Rehabilitation Services Pager:[phone] Office:[phone] 03/25/2021    Elray Mcgregor 03/25/2021, 5:15 PM

## 2021-03-25 NOTE — Progress Notes (Signed)
Inpatient Rehabilitation Admissions Coordinator   I have insurance approval to admit him to CIR today and he is in agreement,. I have alerted Dr Pola Corn, acute team and TOC. I will make the arrangements to admit today.  Ottie Glazier, RN, MSN Rehab Admissions Coordinator [phone] 03/25/2021 3:13 PM

## 2021-03-25 NOTE — Progress Notes (Signed)
Patient arrived on unit, oriented to unit. Reviewed medications, therapy schedule, rehab routine and plan of care. States an understanding of information reviewed. No complications noted at this time. Patient reports no pain and is AX4 Samuel Norris  

## 2021-03-25 NOTE — Progress Notes (Signed)
Inpatient Rehabilitation  Patient information reviewed and entered into eRehab system by Christina Gintz M. Quantae Martel, M.A., CCC/SLP, PPS Coordinator.  Information including medical coding, functional ability and quality indicators will be reviewed and updated through discharge.    

## 2021-03-25 NOTE — Progress Notes (Signed)
Inpatient Rehabilitation Admissions Coordinator   I met at bedside with patient using [place] # [phone]. I continue to await insurance Auth for a possible CIR admit. He continues to be in agreement.  Danne Baxter, RN, MSN Rehab Admissions Coordinator [phone] 03/25/2021 11:33 AM

## 2021-03-25 NOTE — Progress Notes (Signed)
Occupational Therapy Treatment Patient Details Name: Samuel Norris MRN: [id number] DOB: [birth year] Today's Date: 03/25/2021   History of present illness 86 y.o. M admitted on 03/21/21 due to unsteadiness, left posterior headache and vomiting. MRI showed inferior/medial left cerebellum stroke. PMH significant for uncontrolled T2DM, HTN, HLD, morbid obesity, OSA nonadherent to CPAP, smoking.   OT comments  Pt making good progress with OT goals this session. Pt able to tolerate increased activities and mobility this session. Continues to have difficulties with LB ADL's due to truncal weakness and fatigue. Pt supposed to go to AIR today and is very motivated to continue working towards independence. OT will follow acutely.    Recommendations for follow up therapy are one component of a multi-disciplinary discharge planning process, led by the attending physician.  Recommendations may be updated based on patient status, additional functional criteria and insurance authorization.    Follow Up Recommendations  Acute inpatient rehab (3hours/day)    Assistance Recommended at Discharge Intermittent Supervision/Assistance  Patient can return home with the following  A little help with walking and/or transfers;A little help with bathing/dressing/bathroom;Assist for transportation;Assistance with cooking/housework;Direct supervision/assist for medications management;Direct supervision/assist for financial management   Equipment Recommendations  None recommended by OT    Recommendations for Other Services Rehab consult    Precautions / Restrictions Precautions Precautions: Fall Restrictions Weight Bearing Restrictions: No       Mobility Bed Mobility               General bed mobility comments: up in recliner    Transfers Overall transfer level: Needs assistance Equipment used: Rolling walker (2 wheels) Transfers: Sit to/from Stand Sit to Stand: Min assist           General  transfer comment: assist for balance     Balance Overall balance assessment: Needs assistance Sitting-balance support: Feet supported Sitting balance-Leahy Scale: Good     Standing balance support: Reliant on assistive device for balance Standing balance-Leahy Scale: Poor Standing balance comment: able to balance for about 20 sec x 2 without UE support with close S, marching in place with UE support x 5 each till c/o back pain                           ADL either performed or assessed with clinical judgement   ADL Overall ADL's : Needs assistance/impaired     Grooming: Wash/dry hands;Wash/dry face;Oral care;Min guard;Standing Grooming Details (indicate cue type and reason): completed at sink             Lower Body Dressing: Maximal assistance;Sitting/lateral leans;Sit to/from stand Lower Body Dressing Details (indicate cue type and reason): Pt needing max assist to don socks, as he is unable to support his trunk when reaching down to his feet Toilet Transfer: Min guard;Ambulation Toilet Transfer Details (indicate cue type and reason): W/ RW, pt fatiguing         Functional mobility during ADLs: Min guard;Rolling walker (2 wheels) General ADL Comments: Pt with increased activity tolerance and strength this session    Extremity/Trunk Assessment              Vision       Perception     Praxis      Cognition Arousal/Alertness: Awake/alert Behavior During Therapy: WFL for tasks assessed/performed Overall Cognitive Status: Within Functional Limits for tasks assessed  Exercises     Shoulder Instructions       General Comments VSS on RA, motivated to go to CIR    Pertinent Vitals/ Pain       Pain Assessment: No/denies pain  Home Living                                          Prior Functioning/Environment              Frequency  Min 2X/week         Progress Toward Goals  OT Goals(current goals can now be found in the care plan section)  Progress towards OT goals: Progressing toward goals  Acute Rehab OT Goals Patient Stated Goal: to get stronger OT Goal Formulation: With patient Time For Goal Achievement: 04/05/21 Potential to Achieve Goals: Good ADL Goals Pt Will Perform Grooming: with modified independence;standing Pt Will Perform Lower Body Bathing: with modified independence;sit to/from stand;sitting/lateral leans Pt Will Perform Lower Body Dressing: with modified independence;sitting/lateral leans;sit to/from stand Pt Will Transfer to Toilet: with modified independence;ambulating Pt Will Perform Toileting - Clothing Manipulation and hygiene: with modified independence;sitting/lateral leans;sit to/from stand  Plan Discharge plan remains appropriate;Frequency remains appropriate    Co-evaluation                 AM-PAC OT "6 Clicks" Daily Activity     Outcome Measure   Help from another person eating meals?: A Little Help from another person taking care of personal grooming?: A Little Help from another person toileting, which includes using toliet, bedpan, or urinal?: A Little Help from another person bathing (including washing, rinsing, drying)?: A Lot Help from another person to put on and taking off regular upper body clothing?: A Little Help from another person to put on and taking off regular lower body clothing?: A Lot 6 Click Score: 16    End of Session Equipment Utilized During Treatment: Gait belt;Rolling walker (2 wheels)  OT Visit Diagnosis: Unsteadiness on feet (R26.81);Other abnormalities of gait and mobility (R26.89);Muscle weakness (generalized) (M62.81)   Activity Tolerance Patient tolerated treatment well   Patient Left in chair;with call bell/phone within reach   Nurse Communication Mobility status        Time: [id number] OT Time Calculation (min): 31 min  Charges: OT General  Charges $OT Visit: 1 Visit OT Treatments $Self Care/Home Management : 8-22 mins $Therapeutic Activity: 8-22 mins  Ebonique Hallstrom H., OTR/L Acute Rehabilitation  Mame Twombly Elane Yolanda Bonine 03/25/2021, 5:41 PM

## 2021-03-25 NOTE — H&P (Signed)
Physical Medicine and Rehabilitation Admission H&P     HPI: Samuel Norris is an 86 year old right-handed male with history of hyperlipidemia, hypertension, diabetes mellitus, morbid obesity, OSA nonadherent to CPAP and quit smoking 3 years ago.  Per chart review patient lives with daughter.  Two-level home 2 steps to entry.  Uses a walker at times prior to admission.  Presented 03/21/2021 with headache, nausea and vomiting as well as unsteady gait.  Cranial CT scan showed focal area of low density in loss of gray-white differentiation in the right occipital lobe suspicious for infarct likely subacute.  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  No hemorrhage or mass-effect.  Diminished flow related enhancement of the left vertebral artery V4 segment consistent with high-grade stenosis otherwise normal intracranial MRA.  Admission chemistry unremarkable except glucose 254 AST 46 ALT 63, troponin 21, hemoglobin A1c 8.4.  Echocardiogram ejection fraction of 60 to 65% no wall motion abnormalities grade 1 diastolic dysfunction.  Currently maintained on aspirin 325 mg daily and Plavix 75 mg daily x3 months then Plavix alone.  Subcutaneous Lovenox for DVT prophylaxis.  Therapy evaluations completed due to patient decreased functional mobility was admitted for a comprehensive rehab program. Complaints of left posterior headache.   Review of Systems  Constitutional:  Negative for chills and fever.  HENT:  Negative for hearing loss.   Eyes:  Negative for blurred vision and double vision.  Respiratory:  Negative for cough and shortness of breath.   Cardiovascular:  Negative for chest pain, palpitations and leg swelling.  Gastrointestinal:  Positive for nausea and vomiting.  Genitourinary:  Negative for dysuria, flank pain and hematuria.  Musculoskeletal:  Positive for joint pain and myalgias.  Skin:  Negative for rash.  Neurological:  Positive for dizziness and  headaches.       Gait instability  All other systems reviewed and are negative. Past Medical History:  Diagnosis Date   Bilateral iliac artery aneurysm (HCC)    Diabetes mellitus without complication (HCC)    HTN (hypertension)    Hyperlipidemia    Hypothyroid    Obesity    OSA (obstructive sleep apnea)    Peripheral edema    Past Surgical History:  Procedure Laterality Date   LEFT HEART CATH AND CORONARY ANGIOGRAPHY  03/2019   Mild Non-obstructive CAD   Family History  Problem Relation Age of Onset   Stroke Father    Gout Other    Social History:  reports that he has been smoking cigarettes. He does not have any smokeless tobacco history on file. He reports that he does not drink alcohol and does not use drugs. Allergies: No Known Allergies Medications Prior to Admission  Medication Sig Dispense Refill   acetaminophen (TYLENOL) 500 MG tablet Take 1,000 mg by mouth every 6 (six) hours as needed for moderate pain or headache.     aspirin EC 81 MG tablet Take 81 mg by mouth daily. Swallow whole.     atorvastatin (LIPITOR) 80 MG tablet Take 80 mg by mouth at bedtime.     budesonide-formoterol (SYMBICORT) 80-4.5 MCG/ACT inhaler Inhale 2 puffs into the lungs 2 (two) times daily.     fluorometholone (FML) 0.1 % ophthalmic suspension Place 1 drop into both eyes daily.     fluticasone (FLONASE) 50 MCG/ACT nasal spray Place 1 spray into both nostrils daily as needed for allergies.     levothyroxine (SYNTHROID) 88 MCG tablet Take 88 mcg by mouth daily.  loratadine (CLARITIN) 10 MG tablet Take 10 mg by mouth daily as needed for allergies.     losartan (COZAAR) 100 MG tablet Take 100 mg by mouth daily.     omeprazole (PRILOSEC) 20 MG capsule Take 40 mg by mouth daily.     potassium chloride (KLOR-CON) 10 MEQ tablet Take 10 mEq by mouth daily.     diclofenac (VOLTAREN) 75 MG EC tablet Take 75 mg by mouth 2 (two) times daily. (Patient not taking: Reported on 03/21/2021)     furosemide  (LASIX) 20 MG tablet Take 20 mg by mouth daily. (Patient not taking: Reported on 03/21/2021)     metFORMIN (GLUCOPHAGE) 500 MG tablet Take 500 mg by mouth 2 (two) times daily. (Patient not taking: Reported on 03/21/2021)     sitaGLIPtin (JANUVIA) 100 MG tablet Take 100 mg by mouth daily. (Patient not taking: Reported on 03/21/2021)      Drug Regimen Review Drug regimen was reviewed and remains appropriate with no significant issues identified  Home: Home Living Family/patient expects to be discharged to:: Private residence Living Arrangements: Children Available Help at Discharge:  (daughter works 8 am until 10 pm daily) Type of Home: House Home Access: Stairs to enter Technical brewer of Steps: 2 steps Entrance Stairs-Rails: None Home Layout: Two level Alternate Level Stairs-Number of Steps: 15 stairs Alternate Level Stairs-Rails: Left Bathroom Shower/Tub: Chiropodist: Standard Bathroom Accessibility: Yes Home Equipment: Conservation officer, nature (2 wheels), Civil engineer, contracting  Lives With: Daughter   Functional History: Prior Function Prior Level of Function : Independent/Modified Independent Mobility Comments: uses a RW sometimes ADLs Comments: indep  Functional Status:  Mobility: Bed Mobility Overal bed mobility: Needs Assistance Bed Mobility: Sit to Supine Supine to sit: Supervision Sit to supine: Min assist General bed mobility comments: up in recliner Transfers Overall transfer level: Needs assistance Equipment used: Rolling walker (2 wheels) Transfers: Sit to/from Stand Sit to Stand: Min guard General transfer comment: min guard to rise and steady, pt using momentum to power up Ambulation/Gait Ambulation/Gait assistance: Herbalist (Feet): 120 Feet (& 100') Assistive device: Rolling walker (2 wheels) Gait Pattern/deviations: Step-through pattern, Decreased stride length, Drifts right/left General Gait Details: min A for balance/safety, stopped  to rest and check HR and reposition heart monitor HR 67. Gait velocity: decreased Stairs: Yes Stairs assistance: Min assist Stair Management: Alternating pattern, Forwards, Two rails, Step to pattern Number of Stairs: 3 General stair comments: assist for balance/safety    ADL: ADL Overall ADL's : Needs assistance/impaired Eating/Feeding: Set up, Sitting Grooming: Set up, Sitting Upper Body Bathing: Minimal assistance, Sitting Lower Body Bathing: Maximal assistance, Sitting/lateral leans, Sit to/from stand Upper Body Dressing : Min guard, Sitting Lower Body Dressing: Maximal assistance, Sitting/lateral leans, Sit to/from stand Toilet Transfer: Minimal assistance, Stand-pivot Toileting- Clothing Manipulation and Hygiene: Moderate assistance, Sitting/lateral lean, Sit to/from stand Functional mobility during ADLs: Minimal assistance, Rolling walker (2 wheels) General ADL Comments: PT limtied by dizziness and poor balance.  Cognition: Cognition Overall Cognitive Status: Within Functional Limits for tasks assessed Orientation Level: Oriented X4 Cognition Arousal/Alertness: Awake/alert Behavior During Therapy: WFL for tasks assessed/performed Overall Cognitive Status: Within Functional Limits for tasks assessed  Physical Exam: Blood pressure (!) 115/59, pulse 65, temperature 98.3 F (36.8 C), temperature source Oral, resp. rate 19, height 5\' 7"  (1.702 m), weight 120.2 kg, SpO2 96 %. Gen: no distress, normal appearing HEENT: oral mucosa pink and moist, NCAT Cardio: Reg rate Chest: normal effort, normal rate of breathing Abd:  soft, non-distended Ext: no edema Psych: pleasant, normal affect Skin: intact Neurological:     Comments: Patient is alert in NAD.Follows commands .  Provides name and age with assistance of translator.  Fair awareness. Left sided ataxia. Strength and sensation intact.  Results for orders placed or performed during the hospital encounter of 03/21/21 (from  the past 48 hour(s))  Glucose, capillary     Status: Abnormal   Collection Time: 03/23/21 12:53 PM  Result Value Ref Range   Glucose-Capillary 151 (H) 70 - 99 mg/dL    Comment: Glucose reference range applies only to samples taken after fasting for at least 8 hours.  Glucose, capillary     Status: Abnormal   Collection Time: 03/23/21  4:48 PM  Result Value Ref Range   Glucose-Capillary 216 (H) 70 - 99 mg/dL    Comment: Glucose reference range applies only to samples taken after fasting for at least 8 hours.  Glucose, capillary     Status: Abnormal   Collection Time: 03/23/21  7:45 PM  Result Value Ref Range   Glucose-Capillary 196 (H) 70 - 99 mg/dL    Comment: Glucose reference range applies only to samples taken after fasting for at least 8 hours.  Glucose, capillary     Status: Abnormal   Collection Time: 03/24/21  6:12 AM  Result Value Ref Range   Glucose-Capillary 168 (H) 70 - 99 mg/dL    Comment: Glucose reference range applies only to samples taken after fasting for at least 8 hours.   Comment 1 Notify RN    Comment 2 Document in Chart   Glucose, capillary     Status: Abnormal   Collection Time: 03/24/21 11:52 AM  Result Value Ref Range   Glucose-Capillary 183 (H) 70 - 99 mg/dL    Comment: Glucose reference range applies only to samples taken after fasting for at least 8 hours.  Glucose, capillary     Status: Abnormal   Collection Time: 03/24/21  4:16 PM  Result Value Ref Range   Glucose-Capillary 162 (H) 70 - 99 mg/dL    Comment: Glucose reference range applies only to samples taken after fasting for at least 8 hours.  Glucose, capillary     Status: Abnormal   Collection Time: 03/24/21  8:40 PM  Result Value Ref Range   Glucose-Capillary 182 (H) 70 - 99 mg/dL    Comment: Glucose reference range applies only to samples taken after fasting for at least 8 hours.   Comment 1 Notify RN    Comment 2 Document in Chart   Glucose, capillary     Status: Abnormal   Collection  Time: 03/25/21  6:17 AM  Result Value Ref Range   Glucose-Capillary 158 (H) 70 - 99 mg/dL    Comment: Glucose reference range applies only to samples taken after fasting for at least 8 hours.   No results found.     Medical Problem List and Plan: 1. 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  Antithrombotics: -DVT/anticoagulation:  Pharmaceutical: Lovenox  -antiplatelet therapy: Aspirin 325 mg daily and Plavix 75 mg daily x3 months then Plavix alone as noted by neurology services 3. Muscle spasm: continue Flexeril as needed 4. Mood: Melatonin as needed.  Provide emotional support  -antipsychotic agents: N/A 5. Neuropsych: This patient is capable of making decisions on his  own behalf. 6. Skin/Wound Care: Routine skin checks 7. Fluids/Electrolytes/Nutrition: Routine in and outs with follow-up chemistries 8.  Hypertension.  Cozaar 100 mg daily.  Monitor with increased mobility 9.  Hyperlipidemia.  Lipitor 10.  Diabetes mellitus.  Hemoglobin A1c 8.4.  Tradjenta 88 mcg daily.  Check blood sugars before meals and at bedtime 11.  Hypothyroidism.  Synthroid 12.  Obesity.  BMI 41.50.  Dietary follow-up 13.  OSA.  Nonadherent to CPAP.  Quit smoking 3 years ago.  Continue inhalers. 14. Left posterior headache: start topamax 25mg  HS 15. Nausea and vomiting: add prn zofran.     Lavon Paganini Angiulli, PA-C 03/25/2021

## 2021-03-25 NOTE — H&P (Addendum)
Physical Medicine and Rehabilitation Admission H&P   CC: Left PICA infarction  HPI: Samuel Norris is an 86 year old right-handed male with history of hyperlipidemia, hypertension, diabetes mellitus, morbid obesity, OSA nonadherent to CPAP and quit smoking 3 years ago.  Per chart review patient lives with daughter.  Two-level home 2 steps to entry.  Uses a walker at times prior to admission.  Presented 03/21/2021 with headache, nausea and vomiting as well as unsteady gait.  Cranial CT scan showed focal area of low density in loss of gray-white differentiation in the right occipital lobe suspicious for infarct likely subacute.  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  No hemorrhage or mass-effect.  Diminished flow related enhancement of the left vertebral artery V4 segment consistent with high-grade stenosis otherwise normal intracranial MRA.  Admission chemistry unremarkable except glucose 254 AST 46 ALT 63, troponin 21, hemoglobin A1c 8.4.  Echocardiogram ejection fraction of 60 to 65% no wall motion abnormalities grade 1 diastolic dysfunction.  Currently maintained on aspirin 325 mg daily and Plavix 75 mg daily x3 months then Plavix alone.  Subcutaneous Lovenox for DVT prophylaxis.  Therapy evaluations completed due to patient decreased functional mobility was admitted for a comprehensive rehab program. Complaints of left posterior headache.   Review of Systems  Constitutional:  Negative for chills and fever.  HENT:  Negative for hearing loss.   Eyes:  Negative for blurred vision and double vision.  Respiratory:  Negative for cough and shortness of breath.   Cardiovascular:  Negative for chest pain, palpitations and leg swelling.  Gastrointestinal:  Positive for nausea and vomiting.  Genitourinary:  Negative for dysuria, flank pain and hematuria.  Musculoskeletal:  Positive for joint pain and myalgias.  Skin:  Negative for rash.  Neurological:   Positive for dizziness and headaches.       Gait instability  All other systems reviewed and are negative. Past Medical History:  Diagnosis Date   Bilateral iliac artery aneurysm (HCC)    Diabetes mellitus without complication (HCC)    HTN (hypertension)    Hyperlipidemia    Hypothyroid    Obesity    OSA (obstructive sleep apnea)    Peripheral edema    Past Surgical History:  Procedure Laterality Date   LEFT HEART CATH AND CORONARY ANGIOGRAPHY  03/2019   Mild Non-obstructive CAD   Family History  Problem Relation Age of Onset   Stroke Father    Gout Other    Social History:  reports that he has been smoking cigarettes. He does not have any smokeless tobacco history on file. He reports that he does not drink alcohol and does not use drugs. Allergies: No Known Allergies Medications Prior to Admission  Medication Sig Dispense Refill   acetaminophen (TYLENOL) 500 MG tablet Take 1,000 mg by mouth every 6 (six) hours as needed for moderate pain or headache.     [START ON 03/26/2021] aspirin EC 325 MG EC tablet Take 1 tablet (325 mg total) by mouth daily. 30 tablet 0   atorvastatin (LIPITOR) 80 MG tablet Take 80 mg by mouth at bedtime.     budesonide-formoterol (SYMBICORT) 80-4.5 MCG/ACT inhaler Inhale 2 puffs into the lungs 2 (two) times daily.     [START ON 03/26/2021] clopidogrel (PLAVIX) 75 MG tablet Take 1 tablet (75 mg total) by mouth daily.     cyclobenzaprine (FLEXERIL) 10 MG tablet Take 1 tablet (10 mg total) by mouth every 8 (eight) hours as needed for muscle spasms.  30 tablet 0   fluorometholone (FML) 0.1 % ophthalmic suspension Place 1 drop into both eyes daily.     fluticasone (FLONASE) 50 MCG/ACT nasal spray Place 1 spray into both nostrils daily as needed for allergies.     guaiFENesin-dextromethorphan (ROBITUSSIN DM) 100-10 MG/5ML syrup Take 5 mLs by mouth every 4 (four) hours as needed for cough (chest congestion). 118 mL 0   ibuprofen (ADVIL) 600 MG tablet Take 1 tablet  (600 mg total) by mouth every 6 (six) hours as needed for headache. 30 tablet 0   insulin aspart (NOVOLOG) 100 UNIT/ML injection Inject 0-15 Units into the skin 3 (three) times daily with meals. 10 mL 11   insulin aspart (NOVOLOG) 100 UNIT/ML injection Inject 0-5 Units into the skin at bedtime. 10 mL 11   levothyroxine (SYNTHROID) 88 MCG tablet Take 88 mcg by mouth daily.     loratadine (CLARITIN) 10 MG tablet Take 10 mg by mouth daily as needed for allergies.     losartan (COZAAR) 100 MG tablet Take 100 mg by mouth daily.     melatonin 10 MG TABS Take 10 mg by mouth at bedtime as needed.  0   metFORMIN (GLUCOPHAGE) 500 MG tablet Take 500 mg by mouth 2 (two) times daily. (Patient not taking: Reported on 03/21/2021)     omeprazole (PRILOSEC) 20 MG capsule Take 40 mg by mouth daily.     potassium chloride (KLOR-CON) 10 MEQ tablet Take 10 mEq by mouth daily.     sitaGLIPtin (JANUVIA) 100 MG tablet Take 100 mg by mouth daily. (Patient not taking: Reported on 03/21/2021)      Drug Regimen Review Drug regimen was reviewed and remains appropriate with no significant issues identified Home: Home Living Family/patient expects to be discharged to:: Private residence Living Arrangements: Children Available Help at Discharge:  (daughter works 8 am until 10 pm daily) Type of Home: House Home Access: Stairs to enter Technical brewer of Steps: 2 steps Entrance Stairs-Rails: None Home Layout: Two level Alternate Level Stairs-Number of Steps: 15 stairs Alternate Level Stairs-Rails: Left Bathroom Shower/Tub: Chiropodist: Standard Bathroom Accessibility: Yes Home Equipment: Conservation officer, nature (2 wheels), Civil engineer, contracting  Lives With: Daughter   Functional History: Prior Function Prior Level of Function : Independent/Modified Independent Mobility Comments: uses a RW sometimes ADLs Comments: indep   Functional Status:  Mobility: Bed Mobility Overal bed mobility: Needs  Assistance Bed Mobility: Sit to Supine Supine to sit: Supervision Sit to supine: Min assist General bed mobility comments: up in recliner Transfers Overall transfer level: Needs assistance Equipment used: Rolling walker (2 wheels) Transfers: Sit to/from Stand Sit to Stand: Min guard General transfer comment: min guard to rise and steady, pt using momentum to power up Ambulation/Gait Ambulation/Gait assistance: Herbalist (Feet): 120 Feet (& 100') Assistive device: Rolling walker (2 wheels) Gait Pattern/deviations: Step-through pattern, Decreased stride length, Drifts right/left General Gait Details: min A for balance/safety, stopped to rest and check HR and reposition heart monitor HR 67. Gait velocity: decreased Stairs: Yes Stairs assistance: Min assist Stair Management: Alternating pattern, Forwards, Two rails, Step to pattern Number of Stairs: 3 General stair comments: assist for balance/safety   ADL: ADL Overall ADL's : Needs assistance/impaired Eating/Feeding: Set up, Sitting Grooming: Set up, Sitting Upper Body Bathing: Minimal assistance, Sitting Lower Body Bathing: Maximal assistance, Sitting/lateral leans, Sit to/from stand Upper Body Dressing : Min guard, Sitting Lower Body Dressing: Maximal assistance, Sitting/lateral leans, Sit to/from stand Toilet Transfer: Minimal assistance,  Stand-pivot Toileting- Water quality scientist and Hygiene: Moderate assistance, Sitting/lateral lean, Sit to/from stand Functional mobility during ADLs: Minimal assistance, Rolling walker (2 wheels) General ADL Comments: PT limtied by dizziness and poor balance.   Cognition: Cognition Overall Cognitive Status: Within Functional Limits for tasks assessed Orientation Level: Oriented X4 Cognition Arousal/Alertness: Awake/alert Behavior During Therapy: WFL for tasks assessed/performed Overall Cognitive Status: Within Functional Limits for tasks assessed  Physical Exam: Blood  pressure 134/61, pulse 62, temperature 98.6 F (37 C), resp. rate 19, height 5\' 7"  (1.702 m), weight 120.2 kg, SpO2 100 %. Gen: no distress, normal appearing HEENT: oral mucosa pink and moist, NCAT Cardio: Reg rate Chest: normal effort, normal rate of breathing Abd: soft, non-distended Ext: no edema Psych: pleasant, normal affect Skin: intact Neurological:     Comments: Patient is alert and oriented x3 and in NAD.Follows commands .  Provides name and age with assistance of translator.  Fair awareness. Left sided ataxia. Strength and sensation intact.  Results for orders placed or performed during the hospital encounter of 03/21/21 (from the past 48 hour(s))  Glucose, capillary     Status: Abnormal   Collection Time: 03/24/21  6:12 AM  Result Value Ref Range   Glucose-Capillary 168 (H) 70 - 99 mg/dL    Comment: Glucose reference range applies only to samples taken after fasting for at least 8 hours.   Comment 1 Notify RN    Comment 2 Document in Chart   Glucose, capillary     Status: Abnormal   Collection Time: 03/24/21 11:52 AM  Result Value Ref Range   Glucose-Capillary 183 (H) 70 - 99 mg/dL    Comment: Glucose reference range applies only to samples taken after fasting for at least 8 hours.  Glucose, capillary     Status: Abnormal   Collection Time: 03/24/21  4:16 PM  Result Value Ref Range   Glucose-Capillary 162 (H) 70 - 99 mg/dL    Comment: Glucose reference range applies only to samples taken after fasting for at least 8 hours.  Glucose, capillary     Status: Abnormal   Collection Time: 03/24/21  8:40 PM  Result Value Ref Range   Glucose-Capillary 182 (H) 70 - 99 mg/dL    Comment: Glucose reference range applies only to samples taken after fasting for at least 8 hours.   Comment 1 Notify RN    Comment 2 Document in Chart   Glucose, capillary     Status: Abnormal   Collection Time: 03/25/21  6:17 AM  Result Value Ref Range   Glucose-Capillary 158 (H) 70 - 99 mg/dL     Comment: Glucose reference range applies only to samples taken after fasting for at least 8 hours.  Glucose, capillary     Status: Abnormal   Collection Time: 03/25/21 11:44 AM  Result Value Ref Range   Glucose-Capillary 191 (H) 70 - 99 mg/dL    Comment: Glucose reference range applies only to samples taken after fasting for at least 8 hours.   Comment 1 Notify RN    Comment 2 Document in Chart   Glucose, capillary     Status: Abnormal   Collection Time: 03/25/21  3:47 PM  Result Value Ref Range   Glucose-Capillary 171 (H) 70 - 99 mg/dL    Comment: Glucose reference range applies only to samples taken after fasting for at least 8 hours.   No results found.     Medical Problem List and Plan: 1. 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  Antithrombotics: -DVT/anticoagulation:  Pharmaceutical: Lovenox  -antiplatelet therapy: Aspirin 325 mg daily and Plavix 75 mg daily x3 months then Plavix alone as noted by neurology services 3. Muscle spasm: continue Flexeril as needed 4. Mood: Melatonin as needed.  Provide emotional support  -antipsychotic agents: N/A 5. Neuropsych: This patient is capable of making decisions on his own behalf. 6. Skin/Wound Care: Routine skin checks 7. Fluids/Electrolytes/Nutrition: Routine in and outs with follow-up chemistries 8.  Hypertension.  Cozaar 100 mg daily.  Monitor with increased mobility 9.  Hyperlipidemia.  Lipitor 10.  Diabetes mellitus.  Hemoglobin A1c 8.4.  Tradjenta 88 mcg daily.  Check blood sugars before meals and at bedtime 11.  Hypothyroidism.  Synthroid 12.  Obesity.  BMI 41.50.  Dietary follow-up 13.  OSA.  Nonadherent to CPAP.  Quit smoking 3 years ago.  Continue inhalers. 14. Left posterior headache: start topamax 25mg  HS 15. Nausea and vomiting: add prn  zofran.   Lavon Paganini Angiulli, PA-C   I have personally performed a face to face diagnostic evaluation, including, but not limited to relevant history and physical exam findings, of this patient and developed relevant assessment and plan.  Additionally, I have reviewed and concur with the physician assistant's documentation above.  Izora Ribas, MD 03/25/2021

## 2021-03-26 DIAGNOSIS — I63542 Cerebral infarction due to unspecified occlusion or stenosis of left cerebellar artery: Secondary | ICD-10-CM

## 2021-03-26 LAB — CBC WITH DIFFERENTIAL/PLATELET
Abs Immature Granulocytes: 0.04 10*3/uL (ref 0.00–0.07)
Basophils Absolute: 0 10*3/uL (ref 0.0–0.1)
Basophils Relative: 0 %
Eosinophils Absolute: 0.2 10*3/uL (ref 0.0–0.5)
Eosinophils Relative: 2 %
HCT: 45.4 % (ref 39.0–52.0)
Hemoglobin: 15 g/dL (ref 13.0–17.0)
Immature Granulocytes: 0 %
Lymphocytes Relative: 33 %
Lymphs Abs: 3 10*3/uL (ref 0.7–4.0)
MCH: 30 pg (ref 26.0–34.0)
MCHC: 33 g/dL (ref 30.0–36.0)
MCV: 90.8 fL (ref 80.0–100.0)
Monocytes Absolute: 0.6 10*3/uL (ref 0.1–1.0)
Monocytes Relative: 7 %
Neutro Abs: 5.3 10*3/uL (ref 1.7–7.7)
Neutrophils Relative %: 58 %
Platelets: 197 10*3/uL (ref 150–400)
RBC: 5 MIL/uL (ref 4.22–5.81)
RDW: 15.7 % — ABNORMAL HIGH (ref 11.5–15.5)
WBC: 9.2 10*3/uL (ref 4.0–10.5)
nRBC: 0 % (ref 0.0–0.2)

## 2021-03-26 LAB — GLUCOSE, CAPILLARY
Glucose-Capillary: 148 mg/dL — ABNORMAL HIGH (ref 70–99)
Glucose-Capillary: 159 mg/dL — ABNORMAL HIGH (ref 70–99)
Glucose-Capillary: 199 mg/dL — ABNORMAL HIGH (ref 70–99)
Glucose-Capillary: 167 mg/dL — ABNORMAL HIGH (ref 70–99)

## 2021-03-26 LAB — COMPREHENSIVE METABOLIC PANEL
ALT: 51 U/L — ABNORMAL HIGH (ref 0–44)
AST: 36 U/L (ref 15–41)
Albumin: 2.9 g/dL — ABNORMAL LOW (ref 3.5–5.0)
Alkaline Phosphatase: 65 U/L (ref 38–126)
Anion gap: 8 (ref 5–15)
BUN: 14 mg/dL (ref 8–23)
CO2: 26 mmol/L (ref 22–32)
Calcium: 8.7 mg/dL — ABNORMAL LOW (ref 8.9–10.3)
Chloride: 103 mmol/L (ref 98–111)
Creatinine, Ser: 1.1 mg/dL (ref 0.61–1.24)
GFR, Estimated: >60 mL/min (ref >60)
Glucose, Bld: 143 mg/dL — ABNORMAL HIGH (ref 70–99)
Potassium: 4 mmol/L (ref 3.5–5.1)
Sodium: 137 mmol/L (ref 135–145)
Total Bilirubin: 0.8 mg/dL (ref 0.3–1.2)
Total Protein: 6.1 g/dL — ABNORMAL LOW (ref 6.5–8.1)

## 2021-03-26 NOTE — Progress Notes (Signed)
[facility] Individual Statement of Services  Patient Name:  Samuel Norris  Date:  03/26/2021  Welcome to the [facility].  Our goal is to provide you with an individualized program based on your diagnosis and situation, designed to meet your specific needs.  With this comprehensive rehabilitation program, you will be expected to participate in at least 3 hours of rehabilitation therapies Monday-Friday, with modified therapy programming on the weekends.  Your rehabilitation program will include the following services:  Physical Therapy (PT), Occupational Therapy (OT), Speech Therapy (ST), 24 hour per day rehabilitation nursing, Therapeutic Recreaction (TR), Neuropsychology, Care Coordinator, Rehabilitation Medicine, Nutrition Services, and Pharmacy Services  Weekly team conferences will be held on Wednesdays to discuss your progress.  Your Inpatient Rehabilitation Care Coordinator will talk with you frequently to get your input and to update you on team discussions.  Team conferences with you and your family in attendance may also be held.  Expected length of stay: 7-10 Days  Overall anticipated outcome:  MOD I to Intermittent Supervision  Depending on your progress and recovery, your program may change. Your Inpatient Rehabilitation Care Coordinator will coordinate services and will keep you informed of any changes. Your Inpatient Rehabilitation Care Coordinator's name and contact numbers are listed  below.  The following services may also be recommended but are not provided by the [facility]:   [facility] will be made to provide these services after discharge if needed.  Arrangements include referral to agencies that provide these services.  Your insurance has been verified to be:  [facility] Your primary doctor is:  NOT IN SYSTEM  Pertinent information  will be shared with your doctor and your insurance company.  Inpatient Rehabilitation Care Coordinator:  Erlene Quan, [place] or [phone]  Information discussed with and copy given to patient by: Dyanne Iha, 03/26/2021, 1:31 PM

## 2021-03-26 NOTE — Progress Notes (Signed)
PROGRESS NOTE   Subjective/Complaints: Has dizziness but no HA, working with OT on ADLs   ROS- neg CP, SOB, N/V/D   Objective:   No results found. Recent Labs    03/25/21 1910 03/26/21 0508  WBC 10.0 9.2  HGB 15.0 15.0  HCT 45.4 45.4  PLT 200 197   Recent Labs    03/25/21 1910 03/26/21 0508  NA  --  137  K  --  4.0  CL  --  103  CO2  --  26  GLUCOSE  --  143*  BUN  --  14  CREATININE 1.01 1.10  CALCIUM  --  8.7*    Intake/Output Summary (Last 24 hours) at 03/26/2021 [id number] Last data filed at 03/26/2021 0553 Gross per 24 hour  Intake 120 ml  Output 600 ml  Net -480 ml        Physical Exam: Vital Signs Blood pressure 130/60, pulse (!) 53, temperature 98 F (36.7 C), temperature source Oral, resp. rate 18, height 5\' 7"  (1.702 m), weight 120.2 kg, SpO2 96 %.  General: No acute distress Mood and affect are appropriate Heart: Regular rate and rhythm no rubs murmurs or extra sounds Lungs: Clear to auscultation, breathing unlabored, no rales or wheezes Abdomen: Positive bowel sounds, soft nontender to palpation, nondistended Extremities: No clubbing, cyanosis, or edema Skin: No evidence of breakdown, no evidence of rash Neurologic: Cranial nerves II through XII intact, motor strength is 5/5 in bilateral deltoid, bicep, tricep, grip, hip flexor, knee extensors, ankle dorsiflexor and plantar flexor Sensory exam normal sensation to light touch and proprioception in bilateral upper and lower extremities Visual fields intact to confrontation testing  Cerebellar exam normal finger to nose to finger as well as heel to shin in bilateral upper and lower extremities Musculoskeletal: Full range of motion in all 4 extremities. No joint swelling    Assessment/Plan: 1. Functional deficits which require 3+ hours per day of interdisciplinary therapy in a comprehensive inpatient rehab setting. Physiatrist is providing  close team supervision and 24 hour management of active medical problems listed below. Physiatrist and rehab team continue to assess barriers to discharge/monitor patient progress toward functional and medical goals  Care Tool:  Bathing  Bathing activity did not occur: Safety/medical concerns           Bathing assist       Upper Body Dressing/Undressing Upper body dressing Upper body dressing/undressing activity did not occur (including orthotics): Safety/medical concerns What is the patient wearing?: Hospital gown only    Upper body assist Assist Level: Minimal Assistance - Patient > 75%    Lower Body Dressing/Undressing Lower body dressing    Lower body dressing activity did not occur: Safety/medical concerns       Lower body assist       Toileting Toileting    Toileting assist Assist for toileting: Maximal Assistance - Patient 25 - 49%     Transfers Chair/bed transfer  Transfers assist  Chair/bed transfer activity did not occur: Safety/medical concerns        Locomotion Ambulation   Ambulation assist              Walk 10  feet activity   Assist           Walk 50 feet activity   Assist           Walk 150 feet activity   Assist           Walk 10 feet on uneven surface  activity   Assist           Wheelchair     Assist               Wheelchair 50 feet with 2 turns activity    Assist            Wheelchair 150 feet activity     Assist          Blood pressure 130/60, pulse (!) 53, temperature 98 F (36.7 C), temperature source Oral, resp. rate 18, height 5\' 7"  (1.702 m), weight 120.2 kg, SpO2 96 %.  Medical Problem List and Plan: 1. 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   Antithrombotics: -DVT/anticoagulation:  Pharmaceutical: Lovenox             -antiplatelet therapy: Aspirin 325 mg daily and Plavix 75 mg daily x3 months then Plavix alone as noted by neurology services 3. Muscle spasm: continue Flexeril as needed 4. Mood: Melatonin as needed.  Provide emotional support             -antipsychotic agents: N/A 5. Neuropsych: This patient is capable of making decisions on his own behalf. 6. Skin/Wound Care: Routine skin checks 7. Fluids/Electrolytes/Nutrition: Routine in and outs with follow-up chemistries 8.  Hypertension.  Cozaar 100 mg daily.  Monitor with increased mobility Vitals:   03/25/21 2116 03/26/21 0549  BP:  130/60  Pulse:  (!) 53  Resp:  18  Temp:  98 F (36.7 C)  SpO2: 96% 96%   Controlled 1/12 9.  Hyperlipidemia.  Lipitor 10.  Diabetes mellitus.  Hemoglobin A1c 8.4.  Tradjenta 88 mcg daily.  Check blood sugars before meals and at bedtime CBG (last 3)  Recent Labs    03/25/21 1547 03/25/21 2059 03/26/21 0625  GLUCAP 171* 193* 148*    11.  Hypothyroidism.  Synthroid 12.  Obesity.  BMI 41.50.  Dietary follow-up 13.  OSA.  Nonadherent to CPAP.  Quit smoking 3 years ago.  Continue inhalers. 14. Left posterior headache: start topamax 25mg  HS- improved today  15. Nausea and vomiting: add prn zofran.     LOS: 1 days A FACE TO FACE EVALUATION WAS PERFORMED  Charlett Blake 03/26/2021, 8:33 AM

## 2021-03-26 NOTE — Progress Notes (Signed)
Izora Ribas, MD  Physician Physical Medicine and Rehabilitation PMR Pre-admission     Signed Date of Service:  03/23/2021  4:19 PM  Related encounter: ED to Hosp-Admission (Discharged) from 03/21/2021 in [facility]   Signed      Show:Clear all _0 Written_1 Templated_2 Copied  Added by: _3 Cristina Gong, RN_4 Ranell Patrick Clide Deutscher, MD  _5 [place]over for details                                                                                                                                                                                                                                                                                                                                                                                                                                     PMR Admission Coordinator Pre-Admission Assessment   Patient: Samuel Norris is an 86 y.o., male MRN: [id number] DOB: 09-06-1935 Height: _6  (170.2 cm)Weight: 120.2 kg   Insurance Information HMO:     PPO:      PCP:      IPA:      80/20:      OTHER:  PRIMARY: [employer] Medicare   Dual Complete   Policy#: [id number]      Subscriber: pt CM Name: [place]      Phone#: [phone] option #7     Fax#: [phone] Pre-Cert#: [id number]  approved for 7 days    Employer:  Benefits:  Phone #:  [phone]     Name: 1/9 Eff. Date: 03/15/2021     Deduct: none      Out of Pocket Max: $8300      Life Max: none CIR: $1556 co pay per admission      SNF: no copay days 1 until 20; $200 co pay per days days 21 until 100 Outpatient: 80%     Co-Pay: 20% Home Health: 100%      Co-Pay: visits limited per medical neccesity DME: 80%     Co-Pay: 20% Providers: In network  SECONDARY:     Medicaid of [place]  Policy#: [id number] s active 03/23/21 [place] Counselor:        Phone#:    The Data Collection Information Summary for patients in Inpatient Rehabilitation Facilities with attached Privacy Act [place] Records was provided and verbally reviewed with: Patient   Emergency Contact Information Contact Information       Name Relation Home Work [place] Daughter [phone]               Current Medical History  Patient Admitting Diagnosis: CVA   History of Present Illness:  86 year old right-handed male with history of hyperlipidemia, hypertension, diabetes mellitus, morbid obesity, OSA nonadherent to CPAP and quit smoking 3 years ago.   Presented 03/21/2021 with headache, nausea and vomiting as well as unsteady gait.  Cranial CT scan showed focal area of low density in loss of gray-white differentiation in the right occipital lobe suspicious for infarct likely subacute.  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  No hemorrhage or mass-effect.  Diminished flow related enhancement of the left vertebral artery V4 segment consistent with high-grade stenosis otherwise normal intracranial MRA.  Admission chemistry unremarkable except glucose 254 AST 46 ALT 63, troponin 21, hemoglobin A1c 8.4.  Echocardiogram ejection fraction of 60 to 65% no wall motion abnormalities grade 1 diastolic dysfunction.  Currently maintained on aspirin 325 mg daily and Plavix 75 mg daily x3 months then Plavix alone.  Subcutaneous Lovenox for DVT prophylaxis.     Complete NIHSS TOTAL: 0   Patient's medical record from [facility] has been reviewed by the rehabilitation admission coordinator and physician.   Past Medical History      Past Medical History:  Diagnosis Date   Bilateral iliac artery aneurysm (HCC)     Diabetes mellitus without complication (HCC)     HTN (hypertension)     Hyperlipidemia     Hypothyroid     Obesity     OSA (obstructive sleep apnea)     Peripheral edema      Has the  patient had major surgery during 100 days prior to admission? No   Family History   family history includes Gout in an other family member; Stroke in his father.   Current Medications   Current Facility-Administered Medications:    acetaminophen (TYLENOL) tablet 650 mg, 650 mg, Oral, Q4H PRN, 650 mg at 03/25/21 0655 **OR** [DISCONTINUED] acetaminophen (TYLENOL) 160 MG/5ML solution 650 mg, 650 mg, Per Tube, Q4H PRN **OR** [DISCONTINUED] acetaminophen (TYLENOL) suppository 650 mg, 650 mg, Rectal, Q4H PRN, Alcario Drought, Jared M, DO   aspirin EC tablet 325 mg, 325 mg, Oral, Daily, France Ravens, MD, 325 mg at 03/25/21 0923   atorvastatin (LIPITOR) tablet 80 mg, 80 mg, Oral, QHS, Gardner, Jared M, DO, 80 mg at 03/24/21 2110   clopidogrel (PLAVIX) tablet 75 mg, 75 mg, Oral, Daily, Alcario Drought, Jared M, DO, 75  mg at 03/25/21 0924   cyclobenzaprine (FLEXERIL) tablet 10 mg, 10 mg, Oral, Q8H PRN, Dahal, Binaya, MD, 10 mg at 03/25/21 1232   enoxaparin (LOVENOX) injection 60 mg, 60 mg, Subcutaneous, Q24H, Vance Gather B, MD, 60 mg at 03/25/21 0926   fentaNYL (SUBLIMAZE) injection 25 mcg, 25 mcg, Intravenous, Q2H PRN, Etta Quill, DO, 25 mcg at 03/25/21 0925   fluorometholone (FML) 0.1 % ophthalmic suspension 1 drop, 1 drop, Both Eyes, Daily, Alcario Drought, Jared M, DO, 1 drop at 03/25/21 0923   fluticasone (FLONASE) 50 MCG/ACT nasal spray 1 spray, 1 spray, Each Nare, Daily PRN, Alcario Drought, Jared M, DO   guaiFENesin-dextromethorphan (ROBITUSSIN DM) 100-10 MG/5ML syrup 5 mL, 5 mL, Oral, Q4H PRN, Etta Quill, DO, 5 mL at 03/23/21 0307   ibuprofen (ADVIL) tablet 600 mg, 600 mg, Oral, Q6H PRN, Vance Gather B, MD, 600 mg at 03/25/21 0924   insulin aspart (novoLOG) injection 0-15 Units, 0-15 Units, Subcutaneous, TID WC, Alcario Drought, Jared M, DO, 3 Units at 03/25/21 1224   insulin aspart (novoLOG) injection 0-5 Units, 0-5 Units, Subcutaneous, QHS, Gardner, Jared M, DO, 2 Units at 03/22/21 2202   levothyroxine (SYNTHROID) tablet 88  mcg, 88 mcg, Oral, QAC breakfast, Alcario Drought, Jared M, DO, 88 mcg at 03/25/21 0617   linagliptin (TRADJENTA) tablet 5 mg, 5 mg, Oral, Daily, Vance Gather B, MD, 5 mg at 03/25/21 0924   loratadine (CLARITIN) tablet 10 mg, 10 mg, Oral, Daily PRN, Alcario Drought, Jared M, DO   losartan (COZAAR) tablet 100 mg, 100 mg, Oral, Daily, Vance Gather B, MD, 100 mg at 03/25/21 5397   melatonin tablet 10 mg, 10 mg, Oral, QHS PRN, Kristopher Oppenheim, DO, 10 mg at 03/24/21 2110   mometasone-formoterol (DULERA) 100-5 MCG/ACT inhaler 2 puff, 2 puff, Inhalation, BID, Etta Quill, DO, 2 puff at 03/25/21 0922   pantoprazole (PROTONIX) EC tablet 40 mg, 40 mg, Oral, Daily, Alcario Drought, Jared M, DO, 40 mg at 03/25/21 6734   phenol (CHLORASEPTIC) mouth spray 1 spray, 1 spray, Mouth/Throat, PRN, Etta Quill, DO, 1 spray at 03/23/21 0307   Patients Current Diet:  Diet Order                  Diet Carb Modified Fluid consistency: Thin; Room service appropriate? Yes  Diet effective now                       Precautions / Restrictions Precautions Precautions: Fall Restrictions Weight Bearing Restrictions: No    Has the patient had 2 or more falls or a fall with injury in the past year? No   Prior Activity Level Limited Community (1-2x/wk): Independent and driving   Prior Functional Level Self Care: Did the patient need help bathing, dressing, using the toilet or eating? Independent   Indoor Mobility: Did the patient need assistance with walking from room to room (with or without device)? Independent   Stairs: Did the patient need assistance with internal or external stairs (with or without device)? Independent   Functional Cognition: Did the patient need help planning regular tasks such as shopping or remembering to take medications? Independent   Patient Information Do you need or want an interpreter to communicate with a doctor or health care staff?: 1. Yes Non White, Hispanic, [place]  Patient's Response To:   Health Literacy and Transportation Is the patient able to respond to health literacy and transportation needs?: Yes Health Literacy - How often do you need to have  someone help you when you read instructions, pamphlets, or other written material from your doctor or pharmacy?: Never In the past 12 months, has lack of transportation kept you from medical appointments or from getting medications?: No In the past 12 months, has lack of transportation kept you from meetings, work, or from getting things needed for daily living?: No   [place] / [place] Devices/Equipment: Environmental consultant (specify type) Home Equipment: Conservation officer, nature (2 wheels), Shower seat   Prior Device Use: Indicate devices/aids used by the patient prior to current illness, exacerbation or injury?  Uses RW as needed   Current Functional Level Cognition   Overall Cognitive Status: Within Functional Limits for tasks assessed Orientation Level: Oriented X4    Extremity Assessment (includes Sensation/Coordination)   Upper Extremity Assessment: Defer to OT evaluation  Lower Extremity Assessment: RLE deficits/detail, LLE deficits/detail RLE Deficits / Details: strength 5/5 LLE Deficits / Details: strength 5/5     ADLs   Overall ADL's : Needs assistance/impaired Eating/Feeding: Set up, Sitting Grooming: Set up, Sitting Upper Body Bathing: Minimal assistance, Sitting Lower Body Bathing: Maximal assistance, Sitting/lateral leans, Sit to/from stand Upper Body Dressing : Min guard, Sitting Lower Body Dressing: Maximal assistance, Sitting/lateral leans, Sit to/from stand Toilet Transfer: Minimal assistance, Stand-pivot Toileting- Clothing Manipulation and Hygiene: Moderate assistance, Sitting/lateral lean, Sit to/from stand Functional mobility during ADLs: Minimal assistance, Rolling walker (2 wheels) General ADL Comments: PT limtied by dizziness and poor balance.     Mobility   Overal bed mobility:  Needs Assistance Bed Mobility: Sit to Supine Supine to sit: Supervision Sit to supine: Min assist General bed mobility comments: up in recliner     Transfers   Overall transfer level: Needs assistance Equipment used: Rolling walker (2 wheels) Transfers: Sit to/from Stand Sit to Stand: Min guard General transfer comment: min guard to rise and steady, pt using momentum to power up     Ambulation / Gait / Stairs / Wheelchair Mobility   Ambulation/Gait Ambulation/Gait assistance: Herbalist (Feet): 120 Feet (& 100') Assistive device: Rolling walker (2 wheels) Gait Pattern/deviations: Step-through pattern, Decreased stride length, Drifts right/left General Gait Details: min A for balance/safety, stopped to rest and check HR and reposition heart monitor HR 67. Gait velocity: decreased Stairs: Yes Stairs assistance: Min assist Stair Management: Alternating pattern, Forwards, Two rails, Step to pattern Number of Stairs: 3 General stair comments: assist for balance/safety     Posture / Balance Balance Overall balance assessment: Needs assistance Sitting-balance support: Feet supported Sitting balance-Leahy Scale: Good Standing balance support: Reliant on assistive device for balance Standing balance-Leahy Scale: Poor     Special needs/care consideration Hgb A1c 8.4 Smoking cessation advised Spanish Interpreter required Spanish CIR booklets provided    Previous Home Environment  Living Arrangements: Children  Lives With: Daughter Available Help at Discharge:  (daughter works 8 am until 10 pm daily) Type of Home: House Home Layout: Two level Alternate Level Stairs-Rails: Left Alternate Level Stairs-Number of Steps: 15 stairs Home Access: Stairs to enter Entrance Stairs-Rails: None Entrance Stairs-Number of Steps: 2 steps Bathroom Shower/Tub: Chiropodist: Standard Bathroom Accessibility: Yes How Accessible: Accessible via walker Home Care  Services: No   Discharge Living Setting Plans for Discharge Living Setting: Patient's home, House, Lives with (comment) (daughter) Type of Home at Discharge: House Discharge Home Layout: Two level Alternate Level Stairs-Rails: Left Alternate Level Stairs-Number of Steps: 15 Discharge Home Access: Stairs to enter Entrance Stairs-Rails: None Entrance Stairs-Number of  Steps: 2 Discharge Bathroom Shower/Tub: Tub/shower unit Discharge Bathroom Toilet: Standard Discharge Bathroom Accessibility: Yes How Accessible: Accessible via walker Does the patient have any problems obtaining your medications?: No   Social/Family/Support Systems Patient Roles: Parent Contact Information: daughter, Denman George Anticipated Caregiver: daughter Anticipated Caregiver's Contact Information: see contacts Ability/Limitations of Caregiver: Yolanda works 8 am until 10 pm daily Caregiver Availability: Evenings only Discharge Plan Discussed with Primary Caregiver: Yes Is Caregiver In Agreement with Plan?: Yes Does Caregiver/Family have Issues with Lodging/Transportation while Pt is in Rehab?: No   He is divorced for 20 years. Lives with daughter and his [place].   Goals Patient/Family Goal for Rehab: Mod I to intermittent supervision with PT and OT Expected length of stay: ELOS 7 to 10 days Cultural Considerations: Spanish interpreter needed Pt/Family Agrees to Admission and willing to participate: Yes Program Orientation Provided & Reviewed with Pt/Caregiver Including Roles  & Responsibilities: Yes   Decrease burden of Care through IP rehab admission: n/a   Possible need for SNF placement upon discharge: not anticipated   Patient Condition: I have reviewed medical records from [facility], spoken with CM, and patient. I met with patient at the bedside for inpatient rehabilitation assessment.  Patient will benefit from ongoing PT and OT, can actively participate in 3 hours of therapy a day 5  days of the week, and can make measurable gains during the admission.  Patient will also benefit from the coordinated team approach during an Inpatient Acute Rehabilitation admission.  The patient will receive intensive therapy as well as Rehabilitation physician, nursing, social worker, and care management interventions.  Due to bladder management, bowel management, safety, skin/wound care, disease management, medication administration, pain management, and patient education the patient requires 24 hour a day rehabilitation nursing.  The patient is currently min assist overall with mobility and basic ADLs.  Discharge setting and therapy post discharge at home with home health is anticipated.  Patient has agreed to participate in the Acute Inpatient Rehabilitation Program and will admit today.   Preadmission Screen Completed By:  Cleatrice Burke, 03/25/2021 3:15 PM ______________________________________________________________________   Discussed status with Dr. Ranell Patrick on 03/25/2021 at 1516 and received approval for admission today.   Admission Coordinator:  Cleatrice Burke, RN, time  1948 Date  03/25/2021    Assessment/Plan: Diagnosis: CVA Does the need for close, 24 hr/day Medical supervision in concert with the patient's rehab needs make it unreasonable for this patient to be served in a less intensive setting? Yes Co-Morbidities requiring supervision/potential complications: morbid obesity, DM2, HTN, HLD, OSA Due to bladder management, bowel management, safety, skin/wound care, disease management, medication administration, pain management, and patient education, does the patient require 24 hr/day rehab nursing? Yes Does the patient require coordinated care of a physician, rehab nurse, PT, OT, and SLP to address physical and functional deficits in the context of the above medical diagnosis(es)? Yes Addressing deficits in the following areas: balance, endurance, locomotion, strength,  transferring, bowel/bladder control, bathing, dressing, feeding, grooming, toileting, and psychosocial support Can the patient actively participate in an intensive therapy program of at least 3 hrs of therapy 5 days a week? Yes The potential for patient to make measurable gains while on inpatient rehab is excellent Anticipated functional outcomes upon discharge from inpatient rehab: modified independent PT, modified independent OT, independent SLP Estimated rehab length of stay to reach the above functional goals is: 10-14 days Anticipated discharge destination: Home 10. Overall Rehab/Functional Prognosis: excellent     MD  Signature: Leeroy Cha, MD         Revision History                                    Note Details  Author Izora Ribas, MD File Time 03/25/2021  3:19 PM  Author Type Physician Status Signed  Last Editor Izora Ribas, MD Service Physical Medicine and [place] # [id number] Admit Date 03/25/2021

## 2021-03-26 NOTE — Plan of Care (Signed)
°  Problem: RH Balance Goal: LTG Patient will maintain dynamic sitting balance (PT) Description: LTG:  Patient will maintain dynamic sitting balance with assistance during mobility activities (PT) Flowsheets (Taken 03/26/2021 1006) LTG: Pt will maintain dynamic sitting balance during mobility activities with:: Independent with assistive device  Goal: LTG Patient will maintain dynamic standing balance (PT) Description: LTG:  Patient will maintain dynamic standing balance with assistance during mobility activities (PT) Flowsheets (Taken 03/26/2021 1006) LTG: Pt will maintain dynamic standing balance during mobility activities with:: Independent with assistive device    Problem: RH Bed Mobility Goal: LTG Patient will perform bed mobility with assist (PT) Description: LTG: Patient will perform bed mobility with assistance, with/without cues (PT). Flowsheets (Taken 03/26/2021 1006) LTG: Pt will perform bed mobility with assistance level of: Independent with assistive device    Problem: RH Bed to Chair Transfers Goal: LTG Patient will perform bed/chair transfers w/assist (PT) Description: LTG: Patient will perform bed to chair transfers with assistance (PT). Flowsheets (Taken 03/26/2021 1006) LTG: Pt will perform Bed to Chair Transfers with assistance level: Independent with assistive device    Problem: RH Car Transfers Goal: LTG Patient will perform car transfers with assist (PT) Description: LTG: Patient will perform car transfers with assistance (PT). Flowsheets (Taken 03/26/2021 1006) LTG: Pt will perform car transfers with assist:: Supervision/Verbal cueing   Problem: RH Furniture Transfers Goal: LTG Patient will perform furniture transfers w/assist (OT/PT) Description: LTG: Patient will perform furniture transfers  with assistance (OT/PT). Flowsheets (Taken 03/26/2021 1006) LTG: Pt will perform furniture transfers with assist:: Independent with assistive device    Problem: RH  Ambulation Goal: LTG Patient will ambulate in controlled environment (PT) Description: LTG: Patient will ambulate in a controlled environment, # of feet with assistance (PT). Flowsheets (Taken 03/26/2021 1006) LTG: Pt will ambulate in controlled environ  assist needed:: Independent with assistive device LTG: Ambulation distance in controlled environment: 154ft with LRAD Goal: LTG Patient will ambulate in home environment (PT) Description: LTG: Patient will ambulate in home environment, # of feet with assistance (PT). Flowsheets (Taken 03/26/2021 1006) LTG: Pt will ambulate in home environ  assist needed:: Independent with assistive device LTG: Ambulation distance in home environment: 48ft with LRAD   Problem: RH Wheelchair Mobility Goal: LTG Patient will propel w/c in controlled environment (PT) Description: LTG: Patient will propel wheelchair in controlled environment, # of feet with assist (PT) Flowsheets (Taken 03/26/2021 1006) LTG: Pt will propel w/c in controlled environ  assist needed:: Supervision/Verbal cueing LTG: Propel w/c distance in controlled environment: 150   Problem: RH Stairs Goal: LTG Patient will ambulate up and down stairs w/assist (PT) Description: LTG: Patient will ambulate up and down # of stairs with assistance (PT) Flowsheets (Taken 03/26/2021 1006) LTG: Pt will ambulate up/down stairs assist needed:: Supervision/Verbal cueing LTG: Pt will  ambulate up and down number of stairs: 16 steps to access bed/bath in home set up

## 2021-03-26 NOTE — Progress Notes (Addendum)
Inpatient Rehabilitation Admission Medication Review by a Pharmacist  A complete drug regimen review was completed for this patient to identify any potential clinically significant medication issues.  High Risk Drug Classes Is patient taking? Indication by Medication  Antipsychotic No   Anticoagulant Yes Lovenox for DVT prophx.  Antibiotic No   Opioid No   Antiplatelet Yes ASA (x3 months), Plavix for CVA prophx  Hypoglycemics/insulin Yes SSI, linagliptin  for DM  Vasoactive Medication Yes Losartan for HTN  Chemotherapy No   Other Yes Topamax for headaches     Type of Medication Issue Identified Description of Issue Recommendation(s)  Drug Interaction(s) (clinically significant)     Duplicate Therapy     Allergy     No Medication Administration End Date     Incorrect Dose     Additional Drug Therapy Needed  Metformin, Kdur 10/d Start for hyperglycemia (was "not taking" PTA on med rec) Resume for hypokalemia  Significant med changes from prior encounter (inform family/care partners about these prior to discharge).    Other       Clinically significant medication issues were identified that warrant physician communication and completion of prescribed/recommended actions by midnight of the next day:  No  Time spent performing this drug regimen review (minutes):  10 min  Magen Suriano S. Merilynn Finland, PharmD, BCPS Clinical Staff Pharmacist [email or web address] Pasty Spillers 03/26/2021 8:35 AM

## 2021-03-26 NOTE — Evaluation (Signed)
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 year old male with history of hyperlipidemia, hypertension, diabetes mellitus, morbid obesity, OSA nonadherent to CPAP and quit smoking 3 years ago.  Per chart review patient lives with daughter.  Two-level home 2 steps to entry.  Uses a walker at times prior to admission.  Presented 03/21/2021 with headache, nausea and vomiting as well as unsteady gait.  Cranial CT scan showed focal area of low density in loss of gray-white differentiation in the right occipital lobe suspicious for infarct likely subacute.  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  No hemorrhage or mass-effect.  Diminished flow related enhancement of the left vertebral artery V4 segment consistent with high-grade stenosis otherwise normal intracranial MRA.  Admission chemistry unremarkable except glucose 254 AST 46 ALT 63, troponin 21,  hemoglobin A1c 8.4.  Echocardiogram ejection fraction of 60 to 65% no wall motion abnormalities grade 1 diastolic dysfunction.  Currently maintained on aspirin 325 mg daily and Plavix 75 mg daily x3 months then Plavix alone.  Subcutaneous Lovenox for DVT prophylaxis.  Therapy evaluations completed due to patient decreased functional mobility was admitted for a comprehensive rehab program. Complaints of left posterior headache.  Patient transferred to CIR on 03/25/2021 .    Patient currently requires  CGA to mod A  with basic self-care skills and IADL secondary to muscle weakness, decreased cardiorespiratoy endurance, impaired timing and sequencing and unbalanced muscle activation, and decreased sitting balance, decreased standing balance, and decreased balance strategies.  Prior to hospitalization, patient could complete BADL/IADL/mobility with independent .  Patient will benefit from skilled intervention to increase independence with basic self-care skills and increase level of independence with iADL prior to discharge home with care partner.  Anticipate patient will require  no S or physical A  and follow up outpatient.  OT - End of Session Activity Tolerance: Tolerates 30+ min activity with multiple rests Endurance Deficit: Yes Endurance Deficit Description: reports fatigue post ADL OT Assessment Rehab Potential (ACUTE ONLY): Good OT Barriers to Discharge: Decreased caregiver support;Home environment access/layout;Inaccessible home environment OT Patient demonstrates impairments in the following area(s): Balance;Endurance;Motor;Skin Integrity;Pain OT Basic ADL's Functional Problem(s): Grooming;Bathing;Dressing;Toileting OT Advanced ADL's Functional Problem(s): Simple Meal Preparation;Light Housekeeping OT Transfers Functional Problem(s): Tub/Shower;Toilet OT Additional Impairment(s): None OT Plan OT Intensity: Minimum of 1-2 x/day, 45 to 90 minutes OT Frequency: 5 out of 7 days OT  Duration/Estimated Length of Stay: 5-7 days OT Treatment/Interventions: Balance/vestibular training;DME/adaptive equipment instruction;Patient/family education;Therapeutic Activities;Wheelchair propulsion/positioning;Therapeutic  Exercise;Psychosocial support;Community reintegration;Functional mobility training;Self Care/advanced ADL retraining;UE/LE Strength taining/ROM;UE/LE Coordination activities;Skin care/wound managment;Neuromuscular re-education;Discharge planning;Disease mangement/prevention;Pain management;Visual/perceptual remediation/compensation OT Self Feeding Anticipated Outcome(s): ind OT Basic Self-Care Anticipated Outcome(s): mod I OT Toileting Anticipated Outcome(s): mod I OT Bathroom Transfers Anticipated Outcome(s): mod I OT Recommendation Patient destination: Home Follow Up Recommendations: Outpatient OT Equipment Recommended: To be determined   OT Evaluation Precautions/Restrictions  Precautions Precautions: Fall Precaution Comments: dizziness Restrictions Weight Bearing Restrictions: No General Chart Reviewed: Yes Response to Previous Treatment: Not applicable Family/Caregiver Present: No  Pain reports pain in "sides" did not rate Home Living/Prior Functioning Home Living Family/patient expects to be discharged to:: Private residence Living Arrangements: Children Available Help at Discharge: Family, Available PRN/intermittently Type of Home: House Home Access: Stairs to enter [employer] of Steps: 2 steps Entrance Stairs-Rails: None Home Layout: Two level Alternate Level Stairs-Number of Steps: 15 stairs Alternate Level Stairs-Rails: Left Bathroom Shower/Tub: Government social research officer Accessibility: Yes  Lives With: Daughter (daughter works during the day) IADL History Homemaking Responsibilities: Yes Meal Prep Responsibility: Therapist, occupational Responsibility: Primary Cleaning Responsibility: Primary Bill  Paying/Finance Responsibility: Secondary Shopping Responsibility: Secondary Occupation: Retired Leisure and Hobbies: cooking/cleaning; caring for dog "Karilyn Cota" Prior Function Level of Independence: Independent with basic ADLs, Independent with homemaking with ambulation, Independent with gait, Independent with transfers  Able to Take Stairs?: Yes Driving: Yes Vocation: Retired Surveyor, mining Baseline Vision/History: 0 No visual deficits Ability to See in Adequate Light: 0 Adequate Patient Visual Report: No change from baseline Vision Assessment?: No apparent visual deficits Perception  Perception: Within Functional Limits Praxis Praxis: Intact Cognition Overall Cognitive Status: Within Functional Limits for tasks assessed Arousal/Alertness: Awake/alert Orientation Level: Person;Place;Situation Person: Oriented Place: Oriented Situation: Oriented Year: 2023 Month: January Day of Week: Correct Memory: Appears intact Immediate Memory Recall: Sock;Blue;Bed Memory Recall Sock: Not able to recall Memory Recall Blue: Without Cue Memory Recall Bed: Without Cue Problem Solving: Appears intact Safety/Judgment: Appears intact Sensation Sensation Light Touch: Appears Intact Hot/Cold: Appears Intact Proprioception: Appears Intact Stereognosis: Appears Intact Coordination Gross Motor Movements are Fluid and Coordinated: No Fine Motor Movements are Fluid and Coordinated: Yes Coordination and Movement Description: mild trunkal ataxia, heavy reliance on BUE support in standing further limited by generalized deconditioning Finger Nose Finger Test: slowed on L but grossly WFL, impacted by language barrier Heel Shin Test: limited hip flexion due to body habitus but otherwise, grossly WFL Motor  Motor Motor: Ataxia Motor - Skilled Clinical Observations: mild trunkal ataxia, heavy reliance on BUE support and generalized deconditioning  Trunk/Postural Assessment  Cervical  Assessment Cervical Assessment: Within Functional Limits Thoracic Assessment Thoracic Assessment: Exceptions to [facility] (rounded shoulders) Lumbar Assessment Lumbar Assessment: Exceptions to [facility] (decreased rotation and lordosis) Postural Control Postural Control: Within Functional Limits  Balance Balance Balance Assessed: Yes Static Sitting Balance Static Sitting - Balance Support: No upper extremity supported Static Sitting - Level of Assistance: 6: Modified independent (Device/Increase time) Dynamic Sitting Balance Dynamic Sitting - Balance Support: No upper extremity supported Dynamic Sitting - Level of Assistance: 5: Stand by assistance Static Standing Balance Static Standing - Balance Support: Bilateral upper extremity supported Static Standing - Level of Assistance: 5: Stand by assistance Dynamic Standing Balance Dynamic Standing - Balance Support: Bilateral upper extremity supported Dynamic Standing - Level of Assistance: 5: Stand by assistance Dynamic Standing - Comments: CGA with BUE supported on RW Extremity/Trunk Assessment RUE Assessment RUE Assessment: Within Functional Limits LUE Assessment LUE Assessment: Within Functional Limits  Care Tool Care Tool  Self Care Eating   Eating Assist Level: Independent    Oral Care    Oral Care Assist Level: Independent with assistive device Assistive Device Comment: chair  Bathing   Body parts bathed by patient: Chest;Abdomen;Left arm;Right arm;Front perineal area;Right upper leg;Left upper leg Body parts bathed by helper: Left lower leg;Right lower leg;Buttocks   Assist Level: Minimal Assistance - Patient > 75%    Upper Body Dressing(including orthotics)   What is the patient wearing?: Hospital gown only;Pull over shirt   Assist Level: Independent    Lower Body Dressing (excluding footwear)   What is the patient wearing?: Underwear/pull up;Pants Assist for lower body dressing: Moderate Assistance - Patient 50 - 74%     Putting on/Taking off footwear   What is the patient wearing?: Non-skid slipper socks Assist for footwear: Total Assistance - Patient < 25%       Care Tool Toileting Toileting activity   Assist for toileting: Supervision/Verbal cueing (void of bladder only)     Care Tool Bed Mobility Roll left and right activity        Sit to lying activity        Lying to sitting on side of bed activity   Lying to sitting on side of bed assist level: the ability to move from lying on the back to sitting on the side of the bed with no back support.: Independent with assistive device     Care Tool Transfers Sit to stand transfer   Sit to stand assist level: Contact Guard/Touching assist    Chair/bed transfer   Chair/bed transfer assist level: Contact Guard/Touching assist     Toilet transfer   Assist Level: Contact Guard/Touching assist     Care Tool Cognition  Expression of Ideas and Wants Expression of Ideas and Wants: 4. Without difficulty (complex and basic) - expresses complex messages without difficulty and with speech that is clear and easy to understand  Understanding Verbal and Non-Verbal Content Understanding Verbal and Non-Verbal Content: 3. Usually understands - understands most conversations, but misses some part/intent of message. Requires cues at times to understand   Memory/Recall Ability Memory/Recall Ability : Current season;Staff names and faces;That he or she is in a hospital/hospital unit   Refer to Care Plan for [place] 1 OT Short Term Goal 1 (Week 1): STG = LTG 2/2 ELOS  Recommendations for other services: None    Skilled Therapeutic Intervention ADL ADL Eating: Independent Where Assessed-Eating: Bed level;Chair Grooming: Modified independent Where Assessed-Grooming: Sitting at sink Upper Body Bathing: Modified independent Where Assessed-Upper Body Bathing: Sitting at sink Lower Body Bathing: Minimal assistance Where  Assessed-Lower Body Bathing: Standing at sink Upper Body Dressing: Independent Where Assessed-Upper Body Dressing: Sitting at sink Lower Body Dressing: Moderate assistance Where Assessed-Lower Body Dressing: Standing at sink Toileting: Minimal assistance Where Assessed-Toileting: Glass blower/designer: Therapist, music Method: Counselling psychologist: Energy manager: Not assessed Social research officer, government: Not assessed Mobility  Bed Mobility Bed Mobility: Supine to Sit Rolling Right: Minimal Assistance - Patient > 75% Rolling Left: Minimal Assistance - Patient > 75% Supine to Sit: Independent with assistive device (bed rails) Sit to Supine: Minimal Assistance - Patient > 75% Transfers Sit to Stand: Contact Guard/Touching assist Session Note (1610-9604): Pt received semi-reclined in bed, reports stomach pain and requesting to go to restroom. Completes bed mobility with mod I (use of bed rail) and completed ambulatory toilet transfer with CGA and use  of RW. Continent void of bladder.  In person interpreter present throughout session. Reviewed role of CIR OT, evaluation process, ADL/func mobility retraining, goals for therapy, and safety plan. Evaluation completed as documented above. Completed UB dressing, bathing, and grooming with independence seated at sink. Mod A to thread BLE into underwear/pants. Ambulated > recliner similar manner as before. MD and RN in/out morning assessment. Educated pt to call for assist, RN notified no alarm box available in room. Pt left seated in recliner with interpreter/RN present, call bell in reach, and all immediate needs met.   Discharge Criteria: Patient will be discharged from OT if patient refuses treatment 3 consecutive times without medical reason, if treatment goals not met, if there is a change in medical status, if patient makes no progress towards goals or if patient is discharged from hospital.  The above  assessment, treatment plan, treatment alternatives and goals were discussed and mutually agreed upon: by patient  Volanda Napoleon MS, OTR/L  03/26/2021, 9:06 AM

## 2021-03-26 NOTE — Progress Notes (Signed)
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 He does not have any smokeless tobacco history on file. He reports that he does not drink alcohol and does not use drugs.  Family / Support Systems Marital Status: Divorced How Long?: 62 years Children: yolanda Other Supports: Chihuaha puppy Anticipated Caregiver: daughter Ability/Limitations of Caregiver: works 8 AM-10 PM Caregiver Availability: 24/7 Family Dynamics: support from daughter  Social History Preferred language: Spanish Religion: Teacher, English as a foreign language - How often do you need to have someone help you when you read instructions, pamphlets, or other written material from your doctor or pharmacy?: Sometimes Writes: Yes Legal History/Current Legal Issues: n/a Guardian/Conservator: Alisia Ferrari   Abuse/Neglect Abuse/Neglect Assessment Can Be Completed: Yes Physical Abuse: Denies Verbal Abuse: Denies Sexual Abuse: Denies Exploitation of patient/patient's resources: Denies Self-Neglect: Denies  Patient response to: Social Isolation - How often do you feel lonely or isolated from those around you?: Sometimes  Emotional Status Recent Psychosocial Issues: coping Psychiatric History:  n/a Substance Abuse History: quit smoking 3 weeks ago  Patient / Family Perceptions, Expectations & Goals Pt/Family understanding of illness & functional limitations: yes Premorbid pt/family roles/activities: previously independent and driving Anticipated changes in roles/activities/participation: daughter to assist with some task, works during the day. Patient will need to be independent as possible Pt/family expectations/goals: MOD I to [place] Agencies: None Premorbid Home Care/DME Agencies: Other (Comment) Geologist, engineering) Transportation available at discharge: daughter Is the patient able to respond to transportation needs?: Yes In the past 12 months, has lack of transportation kept you from medical appointments or from getting medications?: No In the past 12 months, has lack of transportation kept you from meetings, work, or from getting things needed for daily living?: No Resource referrals recommended: Neuropsychology  Discharge Planning Living Arrangements: Children Support Systems: Children Type of Residence: Private residence Insurance Resources: Multimedia programmer (specify) Financial Resources: Family Support Financial Screen Referred: No Living Expenses: Lives with family Money Management: Patient Does the patient have any problems obtaining your medications?: No Home Management: independent Patient/Family Preliminary Plans: anticipating  MOD I goals Care Coordinator Barriers to Discharge: Other (comments) Care Coordinator Barriers to Discharge Comments: interpreter needed Care Coordinator Anticipated Follow Up Needs: HH/OP Expected length of stay: 7-10 Days  Clinical Impression Sw called patient daughter, left detailed voicemail. Sw will wait for follow up  Dyanne Iha 03/26/2021, 1:30 PM

## 2021-03-26 NOTE — Plan of Care (Signed)
°  Problem: RH Balance Goal: LTG Patient will maintain dynamic standing with ADLs (OT) Description: LTG:  Patient will maintain dynamic standing balance with assist during activities of daily living (OT)  Flowsheets (Taken 03/26/2021 1017) LTG: Pt will maintain dynamic standing balance during ADLs with: Independent with assistive device   Problem: Sit to Stand Goal: LTG:  Patient will perform sit to stand in prep for activites of daily living with assistance level (OT) Description: LTG:  Patient will perform sit to stand in prep for activites of daily living with assistance level (OT) Flowsheets (Taken 03/26/2021 1017) LTG: PT will perform sit to stand in prep for activites of daily living with assistance level: Independent with assistive device   Problem: RH Grooming Goal: LTG Patient will perform grooming w/assist,cues/equip (OT) Description: LTG: Patient will perform grooming with assist, with/without cues using equipment (OT) Flowsheets (Taken 03/26/2021 1017) LTG: Pt will perform grooming with assistance level of: Independent with assistive device    Problem: RH Bathing Goal: LTG Patient will bathe all body parts with assist levels (OT) Description: LTG: Patient will bathe all body parts with assist levels (OT) Flowsheets (Taken 03/26/2021 1017) LTG: Pt will perform bathing with assistance level/cueing: Independent with assistive device    Problem: RH Dressing Goal: LTG Patient will perform lower body dressing w/assist (OT) Description: LTG: Patient will perform lower body dressing with assist, with/without cues in positioning using equipment (OT) Flowsheets (Taken 03/26/2021 1017) LTG: Pt will perform lower body dressing with assistance level of: Independent with assistive device   Problem: RH Toileting Goal: LTG Patient will perform toileting task (3/3 steps) with assistance level (OT) Description: LTG: Patient will perform toileting task (3/3 steps) with assistance level (OT)   Flowsheets (Taken 03/26/2021 1017) LTG: Pt will perform toileting task (3/3 steps) with assistance level: Independent with assistive device   Problem: RH Simple Meal Prep Goal: LTG Patient will perform simple meal prep w/assist (OT) Description: LTG: Patient will perform simple meal prep with assistance, with/without cues (OT). Flowsheets (Taken 03/26/2021 1017) LTG: Pt will perform simple meal prep with assistance level of: Independent with assistive device   Problem: RH Light Housekeeping Goal: LTG Patient will perform light housekeeping w/assist (OT) Description: LTG: Patient will perform light housekeeping with assistance, with/without cues (OT). Flowsheets (Taken 03/26/2021 1017) LTG: Pt will perform light housekeeping with assistance level of: Independent with assistive device   Problem: RH Toilet Transfers Goal: LTG Patient will perform toilet transfers w/assist (OT) Description: LTG: Patient will perform toilet transfers with assist, with/without cues using equipment (OT) Flowsheets (Taken 03/26/2021 1017) LTG: Pt will perform toilet transfers with assistance level of: Independent with assistive device   Problem: RH Tub/Shower Transfers Goal: LTG Patient will perform tub/shower transfers w/assist (OT) Description: LTG: Patient will perform tub/shower transfers with assist, with/without cues using equipment (OT) Flowsheets (Taken 03/26/2021 1017) LTG: Pt will perform tub/shower stall transfers with assistance level of: Independent with assistive device

## 2021-03-26 NOTE — Evaluation (Signed)
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 [facility])   Past Medical History:  Past Medical History:  Diagnosis Date   Bilateral iliac artery aneurysm ([place])    Diabetes mellitus without complication ([place])    HTN (hypertension)    Hyperlipidemia    Hypothyroid    Obesity    OSA (obstructive sleep apnea)    Peripheral edema    Past Surgical History:  Past Surgical History:  Procedure Laterality Date   LEFT HEART CATH AND CORONARY ANGIOGRAPHY  03/2019   Mild Non-obstructive CAD    Assessment & Plan Clinical Impression: Patient is a 86 year old right-handed male with history of hyperlipidemia, hypertension, diabetes mellitus, morbid obesity, OSA nonadherent to CPAP and quit smoking 3 years ago.  Per chart review patient lives with daughter.  Two-level home 2 steps to entry.  Uses a walker at times prior to admission.  Presented 03/21/2021 with headache, nausea and vomiting as well as unsteady gait.  Cranial CT scan showed focal area of low density in loss of gray-white differentiation in the right occipital lobe suspicious for infarct likely subacute.  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  No hemorrhage or mass-effect.  Diminished flow related enhancement of the left vertebral artery V4 segment consistent with high-grade stenosis otherwise normal intracranial MRA.  Admission chemistry unremarkable except glucose 254 AST 46 ALT 63, troponin 21, hemoglobin A1c 8.4.  Echocardiogram  ejection fraction of 60 to 65% no wall motion abnormalities grade 1 diastolic dysfunction.  Currently maintained on aspirin 325 mg daily and Plavix 75 mg daily x3 months then Plavix alone.  Subcutaneous Lovenox for DVT prophylaxis.  Therapy evaluations completed due to patient decreased functional mobility was admitted for a comprehensive rehab program.  Patient transferred to CIR on 03/25/2021 .   Patient currently requires mod with mobility secondary to muscle weakness, decreased cardiorespiratoy endurance, ataxia and decreased coordination, central origin, and decreased standing balance, decreased postural control, and decreased balance strategies.  Prior to hospitalization, patient was independent  with mobility and lived with Daughter in a House home.  Home access is 2 stepsStairs to enter.  Patient will benefit from skilled PT intervention to maximize safe functional mobility, minimize fall risk, and decrease caregiver burden for planned discharge home with intermittent assist.  Anticipate patient will benefit from follow up [facility] at discharge.  PT - End of Session Activity Tolerance: Tolerates 10 - 20 min activity with multiple rests Endurance Deficit: Yes PT Assessment Rehab Potential (ACUTE/IP ONLY): Excellent PT Barriers to Discharge: [place] home environment;Decreased caregiver support;Home environment access/layout;Insurance for SNF coverage PT Patient demonstrates impairments in the following area(s): Balance;Edema;Endurance;Motor;Pain PT Transfers Functional Problem(s): Bed Mobility;Bed to Chair;Car;Floor;Furniture PT Locomotion Functional Problem(s): Ambulation;Wheelchair Mobility;Stairs PT Plan PT Intensity: Minimum of 1-2 x/day ,45 to 90 minutes PT Frequency: 5 out of 7 days PT Duration Estimated Length of Stay: 5-7  days PT Treatment/Interventions: Ambulation/gait training;Balance/vestibular training;Cognitive remediation/compensation;Disease management/prevention;Discharge  planning;Community reintegration;DME/adaptive equipment instruction;Functional mobility training;Patient/family education;Pain management;Functional electrical stimulation;Neuromuscular re-education;Psychosocial support;Splinting/orthotics;Therapeutic Activities;Stair training;UE/LE Coordination activities;UE/LE Strength taining/ROM;Skin care/wound management;Therapeutic Exercise;Visual/perceptual remediation/compensation;Wheelchair propulsion/positioning PT Transfers  Anticipated Outcome(s): Mod I with LRAD PT Locomotion Anticipated Outcome(s): Mod I with LRAD, ambulatory PT Recommendation Recommendations for Other Services: Therapeutic Recreation consult Therapeutic Recreation Interventions: Outing/community reintergration Follow Up Recommendations: Home health PT Patient destination: Home Equipment Recommended: Rolling walker with 5" wheels   PT Evaluation Precautions/Restrictions Precautions Precautions: Fall Restrictions Weight Bearing Restrictions: No General Chart Reviewed: Yes Family/Caregiver Present: No Vital Signs Pain Pain Assessment Pain Scale: 0-10 Pain Score: 0-No pain Pain Interference Pain Interference Pain Effect on Sleep: 0. Does not apply - I have not had any pain or hurting in the past 5 days Pain Interference with Therapy Activities: 2. Occasionally Pain Interference with Day-to-Day Activities: 2. Occasionally Home Living/Prior Functioning Home Living Available Help at Discharge: Family;Available PRN/intermittently Type of Home: House Home Access: Stairs to enter [employer] of Steps: 2 steps Entrance Stairs-Rails: None Home Layout: Two level Alternate Level Stairs-Number of Steps: 15 stairs Alternate Level Stairs-Rails: Left Bathroom Shower/Tub: Chiropodist: Standard Bathroom Accessibility: Yes  Lives With: Daughter Prior Function Level of Independence: Independent with basic ADLs  Able to Take Stairs?: Yes Driving:  Yes Vocation: Retired Vision/Perception  Vision - History Ability to See in Adequate Light: 0 Adequate Perception Perception: Within Functional Limits Praxis Praxis: Intact  Cognition Orientation Level: Oriented X4 Sensation Sensation Light Touch: Appears Intact Proprioception: Appears Intact Coordination Gross Motor Movements are Fluid and Coordinated: No Fine Motor Movements are Fluid and Coordinated: Yes Coordination and Movement Description: mild trunkal ataxia. Finger Nose Finger Test: decreased speed on the R vs L, BUE WFL Heel Shin Test: limited hip flexion due to body habitus but otherwise, grossly WFL Motor  Motor Motor: Ataxia Motor - Skilled Clinical Observations: mild trunkal ataxia.   Trunk/Postural Assessment  Cervical Assessment Cervical Assessment: Within Functional Limits Thoracic Assessment Thoracic Assessment: Exceptions to [facility] (rounded shoulders) Lumbar Assessment Lumbar Assessment: Exceptions to [facility] (decreased rotation and lordosis) Postural Control Postural Control: Within Functional Limits  Balance Balance Balance Assessed: Yes Static Sitting Balance Static Sitting - Balance Support: No upper extremity supported Static Sitting - Level of Assistance: 6: Modified independent (Device/Increase time) Dynamic Sitting Balance Dynamic Sitting - Balance Support: No upper extremity supported Dynamic Sitting - Level of Assistance: 5: Stand by assistance Static Standing Balance Static Standing - Balance Support: No upper extremity supported Static Standing - Level of Assistance: 4: Min assist Dynamic Standing Balance Dynamic Standing - Balance Support: No upper extremity supported Dynamic Standing - Level of Assistance: 3: Mod assist Dynamic Standing - Comments: CGA with BUE supported on RW Extremity Assessment      RLE Assessment RLE Assessment: Exceptions to [facility] General Strength Comments: grossly 4+/5 to 5/5 except hip flexion 4/5. LLE  Assessment LLE Assessment: Within Functional Limits General Strength Comments: grossly 4+/5 to 5/5  Care Tool Care Tool Bed Mobility Roll left and right activity   Roll left and right assist level: Minimal Assistance - Patient > 75%    Sit to lying activity   Sit to lying assist level: Moderate Assistance - Patient 50 - 74%    Lying to sitting on side of bed activity   Lying to sitting on side of bed assist level: the ability to move from lying on the back to sitting on the side of the bed with no back support.: Minimal Assistance - Patient > 75%     Care Tool Transfers Sit to stand transfer   Sit to stand assist level: Minimal Assistance - Patient > 75%  Chair/bed transfer   Chair/bed transfer assist level: Moderate Assistance - Patient 50 - 74%     Toilet transfer   Assist Level: Contact Guard/Touching assist    Car transfer   Car transfer assist level: Moderate Assistance - Patient 50 - 74%      Care Tool Locomotion Ambulation   Assist level: Maximal Assistance - Patient 25 - 49%   Max distance: 48f  Walk 10 feet activity   Assist level: Maximal Assistance - Patient 25 - 49%     Walk 50 feet with 2 turns activity Walk 50 feet with 2 turns activity did not occur: Safety/medical concerns      Walk 150 feet activity Walk 150 feet activity did not occur: Safety/medical concerns      Walk 10 feet on uneven surfaces activity Walk 10 feet on uneven surfaces activity did not occur: Safety/medical concerns      Stairs Stair activity did not occur: Safety/medical concerns        Walk up/down 1 step activity Walk up/down 1 step or curb (drop down) activity did not occur: Safety/medical concerns      Walk up/down 4 steps activity Walk up/down 4 steps activity did not occur: Safety/medical concerns      Walk up/down 12 steps activity Walk up/down 12 steps activity did not occur: Safety/medical concerns      Pick up small objects from floor   Pick up small object  from the floor assist level: Maximal Assistance - Patient 25 - 49%    Wheelchair Is the patient using a wheelchair?: Yes Type of Wheelchair: Manual   Wheelchair assist level: Minimal Assistance - Patient > 75% Max wheelchair distance: 150  Wheel 50 feet with 2 turns activity   Assist Level: Minimal Assistance - Patient > 75%  Wheel 150 feet activity   Assist Level: Minimal Assistance - Patient > 75%    Refer to Care Plan for Long Term Goals  SHORT TERM GOAL WEEK 1 PT Short Term Goal 1 (Week 1): STG=LTG due to ELOS  Recommendations for other services: Therapeutic Recreation  Outing/community reintegration  Skilled Therapeutic Intervention  Session 1.   Mobility Bed Mobility Bed Mobility: Rolling Right;Rolling Left;Supine to Sit;Sit to Supine Rolling Right: Minimal Assistance - Patient > 75% Rolling Left: Minimal Assistance - Patient > 75% Supine to Sit: Moderate Assistance - Patient 50-74% Sit to Supine: Minimal Assistance - Patient > 75% Transfers Transfers: Sit to Stand;Stand Pivot Transfers Sit to Stand: Minimal Assistance - Patient > 75% Stand Pivot Transfers: Contact Guard/Touching assist Stand Pivot Transfer Details (indicate cue type and reason): max assist initially performed without UE support with posterior LOB in turn Transfer (Assistive device): Rolling walker Locomotion  Gait Ambulation: Yes Gait Assistance: Minimal Assistance - Patient > 75%;Contact Guard/Touching assist Gait Distance (Feet): 150 Feet Assistive device: Rolling walker Gait Assistance Details: attempted gait without AD, mod-max assist to prevent fall with turn to prepare for sitting in WC Gait Gait: Yes Gait Pattern: Impaired Gait Pattern: Ataxic;Wide base of support Stairs / Additional Locomotion Stairs: Yes Stairs Assistance: Contact Guard/Touching assist (unable to perform without UE support on rails) Stair Management Technique: Two rails Number of Stairs: 12 Height of Stairs:  4 Wheelchair Mobility Wheelchair Mobility: Yes Wheelchair Assistance: Minimal assistance - Patient >75% Wheelchair Propulsion: Both upper extremities Wheelchair Parts Management: Needs assistance Distance: 150  PT instructed patient in PT Evaluation and initiated treatment intervention; see above for results. PT educated [place] in [place] re[place] potential, rehab  goals, and discharge recommendations along with recommendation for follow-up rehabilitation services.  Patient returned to room and left recliner in [facility] with call bell in reach and all needs met.      Session 2.  Pt received sitting in WC and agreeable to PT. Reports need for BM. Ambulatory transfer to bathroom with RW and CGA for safety. Pt able to have continent bowel movement. Pericare completed by PT, as pt unable to perform adequate pericare due to ROM restrictions.   PT instructed pt in TUG: 25 sec (average of 3 trials; >13.5 sec indicates increased fall risk)  Patient demonstrates increased fall risk as noted by score of   27/56 on Berg Balance Scale.  (<36= high risk for falls, close to 100%; 37-45 significant >80%; 46-51 moderate >50%; 52-55 lower >25%)  Car transfer with RW performed with min assist- CGA and RW. Mild dizziness with turn to WC.   Reports need for urination with supervision assist from PT with UE supported on rail. Patient returned to room and performed stand pivot to recliner with RW and CGA. Pt left sitting in recliner with call bell in reach and all needs met.      Discharge Criteria: Patient will be discharged from PT if patient refuses treatment 3 consecutive times without medical reason, if treatment goals not met, if there is a change in medical status, if patient makes no progress towards goals or if patient is discharged from hospital.  The above assessment, treatment plan, treatment alternatives and goals were discussed and mutually agreed upon: by patient  Lorie Phenix 03/26/2021, 10:11 AM

## 2021-03-26 NOTE — Discharge Instructions (Addendum)
Inpatient Rehab Discharge Instructions  Samuel Norris Discharge date and time: No discharge date for patient encounter.   Activities/Precautions/ Functional Status: Activity: activity as tolerated Diet: diabetic diet Wound Care: Routine skin checks Functional status:  ___ No restrictions     ___ Walk up steps independently ___ 24/7 supervision/assistance   ___ Walk up steps with assistance ___ Intermittent supervision/assistance  ___ Bathe/dress independently ___ Walk with walker     __x_ Bathe/dress with assistance ___ Walk Independently    ___ Shower independently ___ Walk with assistance    ___ Shower with assistance ___ No alcohol     ___ Return to work/school ________  COMMUNITY REFERRALS UPON DISCHARGE:    Home Health:   PT     OT                    Agency: Phone:   Medical Equipment/Items Ordered: Agricultural consultant                                                  Agency/Supplier: [employer] [phone]   Special Instructions: No driving smoking or alcohol  Continue aspirin 325 mg daily until 06/24/2021 then Plavix alone    STROKE/TIA DISCHARGE INSTRUCTIONS SMOKING Cigarette smoking nearly doubles your risk of having a stroke & is the single most alterable risk factor  If you smoke or have smoked in the last 12 months, you are advised to quit smoking for your health. Most of the excess cardiovascular risk related to smoking disappears within a year of stopping. Ask you doctor about anti-smoking medications [place] Quit Line: 1-800-QUIT NOW Free Smoking Cessation Classes [phone]  CHOLESTEROL Know your levels; limit fat & cholesterol in your diet  Lipid Panel     Component Value Date/Time   CHOL 131 03/22/2021 0310   TRIG 99 03/22/2021 0310   HDL 39 (L) 03/22/2021 0310   CHOLHDL 3.4 03/22/2021 0310   VLDL 20 03/22/2021 0310   LDLCALC 72 03/22/2021 0310     Many patients benefit from treatment even if their cholesterol is at goal. Goal: Total  Cholesterol (CHOL) less than 160 Goal:  Triglycerides (TRIG) less than 150 Goal:  HDL greater than 40 Goal:  LDL (LDLCALC) less than 100   BLOOD PRESSURE American Stroke Association blood pressure target is less that 120/80 mm/Hg  Your discharge blood pressure is:  BP: 134/61 Monitor your blood pressure Limit your salt and alcohol intake Many individuals will require more than one medication for high blood pressure  DIABETES (A1c is a blood sugar average for last 3 months) Goal HGBA1c is under 7% (HBGA1c is blood sugar average for last 3 months)  Diabetes:    Lab Results  Component Value Date   HGBA1C 8.4 (H) 03/22/2021    Your HGBA1c can be lowered with medications, healthy diet, and exercise. Check your blood sugar as directed by your physician Call your physician if you experience unexplained or low blood sugars.  PHYSICAL ACTIVITY/REHABILITATION Goal is 30 minutes at least 4 days per week  Activity: Increase activity slowly, Therapies: Physical Therapy: Home Health Return to work:  Activity decreases your risk of heart attack and stroke and makes your heart stronger.  It helps control your weight and blood pressure; helps you relax and can improve your mood. Participate in a regular exercise program. Talk  with your doctor about the best form of exercise for you (dancing, walking, swimming, cycling).  DIET/WEIGHT Goal is to maintain a healthy weight  Your discharge diet is:  Diet Order             Diet Carb Modified Fluid consistency: Thin; Room service appropriate? Yes  Diet effective now                   liquids Your height is:  Height: 5\' 7"  (170.2 cm) Your current weight is: Weight: 120.2 kg Your Body Mass Index (BMI) is:  BMI (Calculated): 41.49 Following the type of diet specifically designed for you will help prevent another stroke. Your goal weight range is:   Your goal Body Mass Index (BMI) is 19-24. Healthy food habits can help reduce 3 risk factors for  stroke:  High cholesterol, hypertension, and excess weight.  RESOURCES Stroke/Support Group:  Call [phone]   STROKE EDUCATION PROVIDED/REVIEWED AND GIVEN TO PATIENT Stroke warning signs and symptoms How to activate emergency medical system (call 911). Medications prescribed at discharge. Need for follow-up after discharge. Personal risk factors for stroke. Pneumonia vaccine given: No Flu vaccine given: No My questions have been answered, the writing is legible, and I understand these instructions.  I will adhere to these goals & educational materials that have been provided to me after my discharge from the hospital.       My questions have been answered and I understand these instructions. I will adhere to these goals and the provided educational materials after my discharge from the hospital.  Patient/Caregiver Signature _______________________________ Date __________  Clinician Signature _______________________________________ Date __________  Please bring this form and your medication list with you to all your follow-up doctor's appointments.

## 2021-03-27 LAB — GLUCOSE, CAPILLARY
Glucose-Capillary: 180 mg/dL — ABNORMAL HIGH (ref 70–99)
Glucose-Capillary: 173 mg/dL — ABNORMAL HIGH (ref 70–99)
Glucose-Capillary: 152 mg/dL — ABNORMAL HIGH (ref 70–99)
Glucose-Capillary: 155 mg/dL — ABNORMAL HIGH (ref 70–99)

## 2021-03-27 MED ORDER — TAMSULOSIN HCL 0.4 MG PO CAPS
0.4000 mg | ORAL_CAPSULE | Freq: Every day | ORAL | Status: DC
  Administered 2021-03-27 – 2021-03-31 (×5): 0.4 mg via ORAL
  Filled 2021-03-27 (×5): qty 1

## 2021-03-27 NOTE — IPOC Note (Signed)
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 [facility])     Functional Problem List: Nursing Medication Management, Bowel, Safety, Pain, Endurance  PT Balance, Edema, Endurance, Motor, Pain  OT Balance, Endurance, Motor, Skin Integrity, Pain  SLP    TR         Basic ADLs: OT Grooming, Bathing, Dressing, Toileting     Advanced  ADLs: OT Simple Meal Preparation, Light Housekeeping     Transfers: PT Bed Mobility, Bed to Chair, Car, Floor, Lobbyist, Technical brewer: PT Ambulation, Psychologist, prison and probation services, Stairs     Additional Impairments: OT None  SLP        TR      Anticipated Outcomes Item Anticipated Outcome  Self Feeding ind  Swallowing      Basic self-care  mod I  Toileting  mod I   Bathroom Transfers mod I  Bowel/Bladder  manage bowel with mod I assist  Transfers  Mod I with LRAD  Locomotion  Mod I with LRAD, ambulatory  Communication     Cognition     Pain  pain at or below level 4 with prn medications  Safety/Judgment  maintain safety with cues   Therapy Plan: PT Intensity: Minimum of 1-2 x/day ,45 to 90 minutes PT Frequency: 5 out of 7 days PT Duration Estimated Length of Stay: 5-7  days OT Intensity: Minimum of 1-2 x/day, 45 to 90 minutes OT Frequency: 5 out of 7 days OT Duration/Estimated Length of Stay: 5-7 days     Due to the current state of emergency, patients may not be receiving their 3-hours of Medicare-mandated therapy.   Team Interventions: Nursing Interventions Disease Management/Prevention, Medication Management, Discharge Planning, Pain Management, Bowel Management, Patient/Family Education  PT interventions Ambulation/gait training,  Warden/ranger, Cognitive remediation/compensation, Disease management/prevention, Discharge planning, Community reintegration, DME/adaptive equipment instruction, Functional mobility training, Patient/family education, Pain management, Functional electrical stimulation, Neuromuscular re-education, Psychosocial support, Splinting/orthotics, Therapeutic Activities, Stair training, UE/LE Coordination activities, UE/LE Strength taining/ROM, Skin care/wound management, Therapeutic Exercise, Visual/perceptual remediation/compensation, Wheelchair propulsion/positioning  OT Interventions Warden/ranger, DME/adaptive equipment instruction, Patient/family education, Therapeutic Activities, Wheelchair propulsion/positioning, Therapeutic Exercise, Psychosocial support, Community reintegration, Functional mobility training, Self Care/advanced ADL retraining, UE/LE Strength taining/ROM, UE/LE Coordination activities, Skin care/wound managment, Neuromuscular re-education, Discharge planning, Disease mangement/prevention, Pain management, Visual/perceptual remediation/compensation  SLP Interventions    TR Interventions    SW/CM Interventions Discharge Planning, Psychosocial Support, Patient/Family Education, Disease Management/Prevention   Barriers to Discharge MD  Medical stability  Nursing Decreased caregiver support, Home environment access/layout 2 level 2ste, 15 steps inside to bedroom with daughter who works 8a-10p daily  PT Inaccessible home environment, Decreased caregiver support, Curator, Community education officer for SNF coverage    OT Decreased caregiver support, Home environment Best boy, Inaccessible home environment    SLP      SW Other (comments) interpreter needed   Team Discharge Planning: Destination: PT-Home ,OT- Home , SLP-  Projected Follow-up: PT-Home health PT, OT-  Outpatient OT, SLP-  Projected Equipment Needs: PT-Rolling walker with 5" wheels,  OT- To be determined, SLP-  Equipment Details: PT- , OT-  Patient/family involved in discharge planning: PT- Patient,  OT-Patient, SLP-   MD ELOS: 5-7d Medical Rehab Prognosis:  Excellent Assessment:  86 year old right-handed male with history  of hyperlipidemia, hypertension, diabetes mellitus, morbid obesity, OSA nonadherent to CPAP and quit smoking 3 years ago.  Per chart review patient lives with daughter.  Two-level home 2 steps to entry.  Uses a walker at times prior to admission.  Presented 03/21/2021 with headache, nausea and vomiting as well as unsteady gait.  Cranial CT scan showed focal area of low density in loss of gray-white differentiation in the right occipital lobe suspicious for infarct likely subacute.  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  No hemorrhage or mass-effect.  Diminished flow related enhancement of the left vertebral artery V4 segment consistent with high-grade stenosis otherwise normal intracranial MRA.  Admission chemistry unremarkable except glucose 254 AST 46 ALT 63, troponin 21, hemoglobin A1c 8.4.  Echocardiogram ejection fraction of 60 to 65% no wall motion abnormalities grade 1 diastolic dysfunction.  Currently maintained on aspirin 325 mg daily and Plavix 75 mg daily x3 months then Plavix alone.  Subcutaneous Lovenox for DVT prophylaxis.  Therapy evaluations completed due to patient decreased functional mobility was admitted for a comprehensive rehab program. Complaints of left posterior headache.       See Team Conference Notes for weekly updates to the plan of care

## 2021-03-27 NOTE — Progress Notes (Signed)
Physical Therapy Session Note  Patient Details  Name: Samuel Norris MRN: [id number] Date of Birth: [birth year]  Today's Date: 03/27/2021 PT Individual Time: 1401-1430 PT Individual Time Calculation (min): 29 min   Short Term Goals: Week 1:  PT Short Term Goal 1 (Week 1): STG=LTG due to ELOS  Skilled Therapeutic Interventions/Progress Updates:  Pt received seated EOB, handoff w/PT. Pt reported 4/10 pain in low back and was agreeable to PT. Pt declined pain meds, offered repositioning and light stretch throughout session for pain modulation. Emphasis of session on core stability for reduction of low back pain w/activity. Pt performed sit <>supine w/S* and performed the following exercises for core activation and low back pain relief:  -Posterior pelvic tilts w/TA contraction and 5s isometric hold, x30. Max verbal and tactile cues for proper form  -Progressed to posterior pelvic tilts w/TA contraction and hip adduction w/pillow, 2x15 w/3s isometric hold. Mod verbal cues to avoid valsalva   Towards end of session, pt very disgruntled over "not being able to do anything by himself". Proceeded to explain CIR safety policies and educate pt on how he is a fall risk due to impaired balance. Pt finally verbalized understanding but likely some miscommunication due to use of virtual interpreter ([employer]). Pt was left supine in bed, all needs in reach.    Therapy Documentation Precautions:  Precautions Precautions: Fall Precaution Comments: dizziness Restrictions Weight Bearing Restrictions: No   Therapy/Group: Individual Therapy [name], PT, DPT  03/27/2021, 7:57 AM

## 2021-03-27 NOTE — Progress Notes (Addendum)
PROGRESS NOTE  Live Spanish interpreter in room Subjective/Complaints:  No issues overnite except freq urination   ROS- neg CP, SOB, N/V/D   Objective:   No results found. Recent Labs    03/25/21 1910 03/26/21 0508  WBC 10.0 9.2  HGB 15.0 15.0  HCT 45.4 45.4  PLT 200 197    Recent Labs    03/25/21 1910 03/26/21 0508  NA  --  137  K  --  4.0  CL  --  103  CO2  --  26  GLUCOSE  --  143*  BUN  --  14  CREATININE 1.01 1.10  CALCIUM  --  8.7*     Intake/Output Summary (Last 24 hours) at 03/27/2021 0801 Last data filed at 03/27/2021 5364 Gross per 24 hour  Intake 600 ml  Output 825 ml  Net -225 ml         Physical Exam: Vital Signs Blood pressure 134/60, pulse (!) 57, temperature 98.1 F (36.7 C), resp. rate 18, height 5\' 7"  (1.702 m), weight 120.2 kg, SpO2 94 %.  General: No acute distress Mood and affect are appropriate Heart: Regular rate and rhythm no rubs murmurs or extra sounds Lungs: Clear to auscultation, breathing unlabored, no rales or wheezes Abdomen: Positive bowel sounds, soft nontender to palpation, nondistended Extremities: No clubbing, cyanosis, or edema Skin: No evidence of breakdown, no evidence of rash Neurologic: Cranial nerves II through XII intact, motor strength is 5/5 in bilateral deltoid, bicep, tricep, grip, hip flexor, knee extensors, ankle dorsiflexor and plantar flexor Sensory exam normal sensation to light touch and proprioception in bilateral upper and lower extremities Visual fields intact to confrontation testing  Cerebellar exam normal finger to nose to finger as well as heel to shin in bilateral upper and lower extremities Musculoskeletal: Full range of motion in all 4 extremities. No joint swelling    Assessment/Plan: 1. Functional deficits which require 3+ hours per day of interdisciplinary therapy in a comprehensive inpatient rehab setting. Physiatrist is  providing close team supervision and 24 hour management of active medical problems listed below. Physiatrist and rehab team continue to assess barriers to discharge/monitor patient progress toward functional and medical goals  Care Tool:  Bathing  Bathing activity did not occur: Safety/medical concerns Body parts bathed by patient: Chest, Abdomen, Left arm, Right arm, Front perineal area, Right upper leg, Left upper leg   Body parts bathed by helper: Left lower leg, Right lower leg, Buttocks     Bathing assist Assist Level: Minimal Assistance - Patient > 75%     Upper Body Dressing/Undressing Upper body dressing Upper body dressing/undressing activity did not occur (including orthotics): Safety/medical concerns What is the patient wearing?: Hospital gown only, Pull over shirt    Upper body assist Assist Level: Independent    Lower Body Dressing/Undressing Lower body dressing    Lower body dressing activity did not occur: Safety/medical concerns What is the patient wearing?: Underwear/pull up, Pants     Lower body assist Assist for lower body dressing: Moderate Assistance - Patient 50 - 74%     Toileting Toileting    Toileting assist Assist for toileting: Supervision/Verbal cueing (void of bladder  only)     Transfers Chair/bed transfer  Transfers assist  Chair/bed transfer activity did not occur: Safety/medical concerns  Chair/bed transfer assist level: Moderate Assistance - Patient 50 - 74%     Locomotion Ambulation   Ambulation assist      Assist level: Maximal Assistance - Patient 25 - 49%   Max distance: 15ft   Walk 10 feet activity   Assist     Assist level: Maximal Assistance - Patient 25 - 49%     Walk 50 feet activity   Assist Walk 50 feet with 2 turns activity did not occur: Safety/medical concerns         Walk 150 feet activity   Assist Walk 150 feet activity did not occur: Safety/medical concerns         Walk 10 feet on  uneven surface  activity   Assist Walk 10 feet on uneven surfaces activity did not occur: Safety/medical concerns         Wheelchair     Assist Is the patient using a wheelchair?: Yes Type of Wheelchair: Manual    Wheelchair assist level: Minimal Assistance - Patient > 75% Max wheelchair distance: 150    Wheelchair 50 feet with 2 turns activity    Assist        Assist Level: Minimal Assistance - Patient > 75%   Wheelchair 150 feet activity     Assist      Assist Level: Minimal Assistance - Patient > 75%   Blood pressure 134/60, pulse (!) 57, temperature 98.1 F (36.7 C), resp. rate 18, height 5\' 7"  (1.702 m), weight 120.2 kg, SpO2 94 %.  Medical Problem List and Plan: 1. 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  Antithrombotics: -DVT/anticoagulation:  Pharmaceutical: Lovenox             -antiplatelet therapy: Aspirin 325 mg daily and Plavix 75 mg daily x3 months then Plavix alone as noted by neurology services 3. Muscle spasm: continue Flexeril as needed 4. Mood: Melatonin as needed.  Provide emotional support             -antipsychotic agents: N/A 5. Neuropsych: This patient is capable of making decisions on his own behalf. 6. Skin/Wound Care: Routine skin checks 7. Fluids/Electrolytes/Nutrition: Routine in and outs with follow-up chemistries 8.  Hypertension.  Cozaar 100 mg daily.  Monitor with increased mobility Vitals:   03/26/21 2111 03/27/21 0532  BP:  134/60  Pulse:  (!) 57  Resp:  18  Temp:  98.1 F (36.7 C)  SpO2: (!) 87% 94%   Controlled 1/13 9.  Hyperlipidemia.  Lipitor 10.  Diabetes mellitus.  Hemoglobin A1c 8.4.  Tradjenta 5mg  daily.  Check blood sugars before meals and at bedtime CBG (last 3)  Recent Labs     03/26/21 1655 03/26/21 2114 03/27/21 0614  GLUCAP 199* 167* 180*     11.  Hypothyroidism.  Synthroid 12.  Obesity.  BMI 41.50.  Dietary follow-up 13.  OSA.  Nonadherent to CPAP.  Quit smoking 3 years ago.  Continue inhalers. 14. Left posterior headache: start topamax 25mg  HS- improved  15. Nausea and vomiting:  add prn zofran.   16.  Freq urination , no dysuria, per pt has been told by MD that his prostate was enlarged, will start flomax trial   LOS: 2 days A FACE TO FACE EVALUATION WAS PERFORMED  Samuel Norris E Samuel Norris 03/27/2021, 8:01 AM

## 2021-03-27 NOTE — Progress Notes (Signed)
Physical Therapy Session Note  Patient Details  Name: Samuel Norris MRN: [id number] Date of Birth: 03-21-1935  Today's Date: 03/27/2021 PT Individual Time: 0905-1003 PT Individual Time Calculation (min): 58 min   Short Term Goals: Week 1:  PT Short Term Goal 1 (Week 1): STG=LTG due to ELOS  Skilled Therapeutic Interventions/Progress Updates:  Patient seated in recliner on entrance to room. Patient alert and agreeable to PT session. In person interpreter present throughout session.   Patient with mild pain complaint in low back during session when taking larger steps.  Therapeutic Activity: Transfers: Patient performed sit<>stand and stand pivot transfers throughout session with CGA/supervision for balance/ dizziness. Provided verbal cues for taking time upon positional transitions to allow dizziness to dissipate. Dizziness resolves within 20 seconds throughout this session.  Gait Training:  Patient ambulated 200 ft x2 using RW with CGA/ close supervision. Also ambulates short distances within therapy gym with no AD requiring CGA. Quality of gait decreases without AD as pt's balance varies. Demonstrated smaller step length/ height and increased sway without AD. Provided vc/ tc for slowing pace and maintaining conscious practice of movements required to weight shift and take steps with upright posture.  Neuromuscular Re-ed: NMR facilitated during session with focus on standing balance and dynamic gait. Pt guided in agility ladder training for balance. Guided in stepping of both feet into each square leading with alternating LE. Difficulty Leading with RLE requiring increased lateral rotation of pelvis. Progressed to stepping one LE into each square. Instructions provided to take time and allow for motor planning and coordination center of brain to formulate plan and to shift weight from back to front foot slowly with intent and increasing conscious practice of weight shift. Pt attempts to  complete quickly because he is not confident of balance. Pt c/o increased soreness in low back musculature with increased step length into each square. Progressed to lateral stepping in order to adjust direction of weight shift. Seated rest break provided between bouts for rest to low back and for pt's low activity tolerance.   Pt guided in dance steps for slow movement latin dance in order to promote weight shift and improve confidence in balance.  NMR performed for improvements in motor control and coordination, balance, sequencing, judgement, and self confidence/ efficacy in performing all aspects of mobility at highest level of independence.   Patient seated upright  in recliner at end of session with brakes locked, seat pad alarm set, and all needs within reach.     Therapy Documentation Precautions:  Precautions Precautions: Fall Precaution Comments: dizziness Restrictions Weight Bearing Restrictions: No General:   Vital Signs: Therapy Vitals Temp: 98.5 F (36.9 C) Temp Source: Oral Pulse Rate: 64 Resp: 18 BP: 122/61 Patient Position (if appropriate): Lying Oxygen Therapy SpO2: 96 % O2 Device: Room Air Pain:  Low level pain complaint with longer distance stepping. Addressed with change in activity and repositioning.   Therapy/Group: Individual Therapy  Alger Simons PT, DPT 03/27/2021, 5:43 PM

## 2021-03-27 NOTE — Progress Notes (Signed)
Occupational Therapy Session Note  Patient Details  Name: Samuel Norris MRN: [id number] Date of Birth: 1935/10/05  Today's Date: 03/27/2021 OT Individual Time: 0802-0900 OT Individual Time Calculation (min): 58 min    Short Term Goals: Week 1:  OT Short Term Goal 1 (Week 1): STG = LTG 2/2 ELOS  Skilled Therapeutic Interventions/Progress Updates:    Pt in recliner to start with interpreter present.  He was agreeable to completion of toileting tasks and showering this session.  Min assist without an assistive device for functional mobility to the bathroom.  No reports of dizziness when asked while moving around the room.  Dyspnea 2/4 with all tasks but O2 sats remained greater than 92% throughout on room air.  He was able to complete all bathing sit to stand with min guard assist and use of the grab bars for support.  He completed all bathing except for washing his lower legs and feet secondary to flexibility issues.  Feel he will benefit from use of a LH sponge.  He was able to ambulate back out to the EOB with min assist and completed dressing after drying off.  He was able to complete all dressing with min guard sit to stand with supervision for donning his gripper socks and shoes, bringing his LEs up in side sitting on the bed.  Finished session with ambulation over to the sink with the RW and min guard assist where he was able to clean his dentures and then return to the recliner.  He was left with safety alarm in place and with the call button and phone in reach.    Therapy Documentation Precautions:  Precautions Precautions: Fall Precaution Comments: dizziness Restrictions Weight Bearing Restrictions: No  Pain: Pain Assessment Pain Scale: 0-10 Pain Score: 0-No pain ADL: See Care Tool Section for some details of mobility and selfcare   Therapy/Group: Individual Therapy  Emad Brechtel OTR/L 03/27/2021, 10:00 AM

## 2021-03-28 LAB — GLUCOSE, CAPILLARY
Glucose-Capillary: 159 mg/dL — ABNORMAL HIGH (ref 70–99)
Glucose-Capillary: 140 mg/dL — ABNORMAL HIGH (ref 70–99)
Glucose-Capillary: 151 mg/dL — ABNORMAL HIGH (ref 70–99)
Glucose-Capillary: 198 mg/dL — ABNORMAL HIGH (ref 70–99)

## 2021-03-28 NOTE — Progress Notes (Signed)
Physical Therapy Session Note  Patient Details  Name: Samuel Norris MRN: [id number] Date of Birth: April 05, 1935  Today's Date: 03/28/2021 PT Individual Time: 0901-0959  PT Individual Time Calculation (min): 58 min    Short Term Goals: Week 1:  PT Short Term Goal 1 (Week 1): STG=LTG due to ELOS  Skilled Therapeutic Interventions/Progress Updates:  Patient seated upright in recliner on entrance to room. Patient alert and agreeable to PT session. In person interpreter present throughout session.  Patient with no pain complaint throughout session.  Therapeutic Activity: Transfers: Patient performed sit<>stand and stand pivot transfers throughout session with close supervision and one instance of CGA for improved positioning prior to descent to sit. Provided verbal cues for reaching back to seat prior to descent to sit. Toilet transfer performed with close supervision and pericare/ dressing with IND.    Gait Training:  Patient ambulated 200' x2 and 155' x2 using no AD with close supervision and intermittent CGA for balance. Demonstrated correct stepping strategy when necessary with minor LOB during turns. Improved quality from previous session. Able to ambulate final distance while carrying cup of coffee with lid back to room from nurse's station. Provided vc/ tc for taking time and not rushing d/t feeling fatigued as this will decrease safety and balance.  Neuromuscular Re-ed: NMR facilitated during session with focus on standing balance. Pt guided in dynamic stepping activity requiring one step in order to reach targets on wall placed from mid thigh to overhead height and outside of armspan reach to each side. Pt performs well and is able to adjust LE he steps with in order to improve reach to target. Can increase to 3 called numbers in a row and can increase in speed of numbers called with no LOB. NMR performed for improvements in motor control and coordination, balance, sequencing,  judgement, and self confidence/ efficacy in performing all aspects of mobility at highest level of independence.   Patient seated  in recliner with BLE elevated at end of session with brakes locked, seat pad alarm set, and all needs within reach.     Therapy Documentation Precautions:  Precautions Precautions: Fall Precaution Comments: dizziness Restrictions Weight Bearing Restrictions: No General:   Vital Signs:  Pain: Pain Assessment Pain Scale: 0-10 Pain Score: 0-No pain  Therapy/Group: Individual Therapy  Loel Dubonnet PT, DPT 03/28/2021, 10:00 AM

## 2021-03-28 NOTE — Progress Notes (Signed)
Occupational Therapy Session Note  Patient Details  Name: Samuel Norris MRN: [id number] Date of Birth: 1936-02-03  Today's Date: 03/28/2021 OT Individual Time: 4175-3010 OT Individual Time Calculation (min): 57 min    Short Term Goals: Week 1:  OT Short Term Goal 1 (Week 1): STG = LTG 2/2 ELOS  Skilled Therapeutic Interventions/Progress Updates:    Pt received seated in recliner with RN present, no c/o pain throughout, agreeable to therapy. Session focus on self-care retraining, activity tolerance, transfer retraining, falls prevention education in prep for improved ADL/IADL/func mobility performance + decreased caregiver burden. Live interpreter present throughout session. Ambulated  toilet > 3in1 in shower with RW and overall close S. Continent void of bladder and required only min A to remove socks. Completed full-body bathing at sit to stand level with close S and heavy use of grab bars/3in1 to sit on. Issued LH sponge to increase safety for lower BLE access as pt with preference putting feet up on 3in1. Completed UB dressing with ind, donned socks with S seated in recliner, and donned pants in shower with min A due to mild LOB to his R without BUE support on bed rail. Completed shaving in stance at sink with close S.  Issued walker bag and discussed environmental set-up, energy conservation, and falls prevention (doing tasks seated, having cell phone on person in case of emergency, using AD as recommended). Pt adamant that he will not fall as he has not fallen his entire life, educated that this was prior before CVA.  Pt left seated in recliner with interpreter present awaiting following PT session, call bell in reach, and all immediate needs met.    Therapy Documentation Precautions:  Precautions Precautions: Fall Precaution Comments: dizziness Restrictions Weight Bearing Restrictions: No  Pain:no c/o throughout ADL: See Care Tool for more details. Therapy/Group: Individual  Therapy  Volanda Napoleon MS, OTR/L  03/28/2021, 6:59 AM

## 2021-03-28 NOTE — Progress Notes (Signed)
Physical Therapy Session Note  Patient Details  Name: Samuel Norris MRN: [id number] Date of Birth: 1936/01/15  Today's Date: 03/28/2021 PT Individual Time: 1300-1409 PT Individual Time Calculation (min): 69 min   Short Term Goals: Week 1:  PT Short Term Goal 1 (Week 1): STG=LTG due to ELOS  Skilled Therapeutic Interventions/Progress Updates:   Pt received sitting in recliner and agreeable to PT. Pt reports need for urination. Ambulatory transfer to toilet with supervision assist for safety with UE support on RW.  Daughter present throughout session for education.   Gait training with AD x 180f and supervision assist and without AD x 757fwith CGA for safety in turns. Cues for decreased speed of turn with RW to reduce fall risk.   Stair management training x 16 with intermittent UE and therapeutic rest break after 8 and 12 steps. Cues from PT for use of BUE support on rail to simulate access to bed/bath on 2nd floor; improved posture, safety, step length and eccentric control with use of BUE support.   Car transfer training with RW and supervision assist from PT with cues for sit>pivot technique to reduce time in SLS.   Pt transported to atrium in WC. Gait training on unlevel tiled floor through atrium with supervision assist with min cues for AD management through tight space around couches.   NUstep reciprocal movement training x 5 min, level 3 with cues for sustained RPM as tolerated.   Patient returned to room and requested to use restroom. Ambulatory transfer to toilet with supervision assist and cues for use of rail for clothing management safety. Performed stand pivot to recliner with RW and supervision assist with cues for decreased speed for safety.. Pt left sitting in recliner with call bell in reach and all needs met.         Therapy Documentation Precautions:  Precautions Precautions: Fall Precaution Comments: dizziness Restrictions Weight Bearing Restrictions:  No  Pain:  denies   Therapy/Group: Individual Therapy  AuLorie Phenix/14/2023, 2:16 PM

## 2021-03-28 NOTE — Progress Notes (Signed)
Physical Therapy Session Note  Patient Details  Name: Samuel Norris MRN: [id number] Date of Birth: 1936-01-10  Today's Date: 03/27/2021 PT Individual Time:1305-1400   55 min   Short Term Goals: Week 1:  PT Short Term Goal 1 (Week 1): STG=LTG due to ELOS  Skilled Therapeutic Interventions/Progress Updates:   Pt received sitting on toilet and agreeable to PT. Pt able to void bladder and NT aware. Pt transported to rehab gym in [facility]. Dynamic gait and balance training in parallel bars: tandem stance 3 x 30sec with 2 progressing to 0 UE support, standing on ariex pad 3 x 30sec progressing from 2 to 0 UE support. Forward reverse gait with 2 to 0 UE support as well as side stepping R and L wih 0-2 UE support supervision assist throughout except on airex pad requiring min assist with occasional posterior LOB. Gait triaining with RW to room on 5c from 4W gym. Supervision assist throughout with min cues for safety onto and off of elevator. Encouraged daughter to come to PT on 1/14 for education of possible. Pt returned to room and performed ambulatory transfer to bed with supervision assist  for safety and RW. Sit>supine completed without assist, and left supine in bed with call bell in reach and all needs met.             Therapy Documentation Precautions:  Precautions Precautions: Fall Precaution Comments: dizziness Restrictions Weight Bearing Restrictions: No  Pain: denies  Therapy/Group: Individual Therapy  Lorie Phenix 03/28/2021, 7:45 AM

## 2021-03-29 DIAGNOSIS — I63542 Cerebral infarction due to unspecified occlusion or stenosis of left cerebellar artery: Secondary | ICD-10-CM | POA: Diagnosis not present

## 2021-03-29 LAB — GLUCOSE, CAPILLARY
Glucose-Capillary: 131 mg/dL — ABNORMAL HIGH (ref 70–99)
Glucose-Capillary: 163 mg/dL — ABNORMAL HIGH (ref 70–99)
Glucose-Capillary: 190 mg/dL — ABNORMAL HIGH (ref 70–99)
Glucose-Capillary: 164 mg/dL — ABNORMAL HIGH (ref 70–99)

## 2021-03-29 NOTE — Progress Notes (Signed)
PROGRESS NOTE   Subjective/Complaints:  RN spanish speaking  interpreting for visit  Pt has some midline low back pain, usually does not have a home but states it started after the stroke   ROS- neg CP, SOB, N/V/D   Objective:   No results found. No results for input(s): WBC, HGB, HCT, PLT in the last 72 hours.  No results for input(s): NA, K, CL, CO2, GLUCOSE, BUN, CREATININE, CALCIUM in the last 72 hours.   Intake/Output Summary (Last 24 hours) at 03/29/2021 1001 Last data filed at 03/29/2021 0700 Gross per 24 hour  Intake 700 ml  Output 650 ml  Net 50 ml         Physical Exam: Vital Signs Blood pressure (!) 154/72, pulse (!) 54, temperature 97.9 F (36.6 C), resp. rate 18, height 5\' 7"  (1.702 m), weight 120.2 kg, SpO2 90 %.  General: No acute distress Mood and affect are appropriate Heart: Regular rate and rhythm no rubs murmurs or extra sounds Lungs: Clear to auscultation, breathing unlabored, no rales or wheezes Abdomen: Positive bowel sounds, soft nontender to palpation, nondistended Extremities: No clubbing, cyanosis, or edema Skin: No evidence of breakdown, no evidence of rash Neurologic: Cranial nerves II through XII intact, motor strength is 5/5 in bilateral deltoid, bicep, tricep, grip, hip flexor, knee extensors, ankle dorsiflexor and plantar flexor Sensory exam normal sensation to light touch and proprioception in bilateral upper and lower extremities Visual fields intact to confrontation testing  Cerebellar exam normal finger to nose to finger as well as heel to shin in bilateral upper and lower extremities Musculoskeletal: tenderness to palpation midline in lumbar spine  No joint swelling    Assessment/Plan: 1. Functional deficits which require 3+ hours per day of interdisciplinary therapy in a comprehensive inpatient rehab setting. Physiatrist is providing close team supervision and 24 hour  management of active medical problems listed below. Physiatrist and rehab team continue to assess barriers to discharge/monitor patient progress toward functional and medical goals  Care Tool:  Bathing  Bathing activity did not occur: Safety/medical concerns Body parts bathed by patient: Chest, Abdomen, Left arm, Right arm, Front perineal area, Right upper leg, Left upper leg, Buttocks, Face, Right lower leg, Left lower leg   Body parts bathed by helper: Right lower leg, Left lower leg     Bathing assist Assist Level: Supervision/Verbal cueing     Upper Body Dressing/Undressing Upper body dressing Upper body dressing/undressing activity did not occur (including orthotics): Safety/medical concerns What is the patient wearing?: Pull over shirt    Upper body assist Assist Level: Independent    Lower Body Dressing/Undressing Lower body dressing    Lower body dressing activity did not occur: Safety/medical concerns What is the patient wearing?: Pants     Lower body assist Assist for lower body dressing: Minimal Assistance - Patient > 75%     Toileting Toileting    Toileting assist Assist for toileting: Supervision/Verbal cueing     Transfers Chair/bed transfer  Transfers assist  Chair/bed transfer activity did not occur: Safety/medical concerns  Chair/bed transfer assist level: Supervision/Verbal cueing     Locomotion Ambulation   Ambulation assist  Assist level: Minimal Assistance - Patient > 75% Assistive device: No Device Max distance: 59ft   Walk 10 feet activity   Assist     Assist level: Maximal Assistance - Patient 25 - 49%     Walk 50 feet activity   Assist Walk 50 feet with 2 turns activity did not occur: Safety/medical concerns         Walk 150 feet activity   Assist Walk 150 feet activity did not occur: Safety/medical concerns         Walk 10 feet on uneven surface  activity   Assist Walk 10 feet on uneven surfaces  activity did not occur: Safety/medical concerns         Wheelchair     Assist Is the patient using a wheelchair?: Yes Type of Wheelchair: Manual    Wheelchair assist level: Minimal Assistance - Patient > 75% Max wheelchair distance: 150    Wheelchair 50 feet with 2 turns activity    Assist        Assist Level: Minimal Assistance - Patient > 75%   Wheelchair 150 feet activity     Assist      Assist Level: Minimal Assistance - Patient > 75%   Blood pressure (!) 154/72, pulse (!) 54, temperature 97.9 F (36.6 C), resp. rate 18, height 5\' 7"  (1.702 m), weight 120.2 kg, SpO2 90 %.  Medical Problem List and Plan: 1. 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  Antithrombotics: -DVT/anticoagulation:  Pharmaceutical: Lovenox             -antiplatelet therapy: Aspirin 325 mg daily and Plavix 75 mg daily x3 months then Plavix alone as noted by neurology services 3. Lumbar strain Muscle spasm: continue Flexeril as needed, trial lidoderm patch  4. Mood: Melatonin as needed.  Provide emotional support             -antipsychotic agents: N/A 5. Neuropsych: This patient is capable of making decisions on his own behalf. 6. Skin/Wound Care: Routine skin checks 7. Fluids/Electrolytes/Nutrition: Routine in and outs with follow-up chemistries 8.  Hypertension.  Cozaar 100 mg daily.  Monitor with increased mobility Vitals:   03/28/21 1907 03/29/21 0444  BP: (!) 124/49 (!) 154/72  Pulse: (!) 57 (!) 54  Resp: 18 18  Temp: 98.4 F (36.9 C) 97.9 F (36.6 C)  SpO2: 92% 90%   Early am systolic elevation but controlled during the day , will not add additional BP meds to avoid hypotension during the day  9.  Hyperlipidemia.  Lipitor 10.  Diabetes mellitus.  Hemoglobin  A1c 8.4.  Tradjenta 5mg  daily.  Check blood sugars before meals and at bedtime CBG (last 3)  Recent Labs    03/28/21 1702 03/28/21 2105 03/29/21 0613  GLUCAP 151* 198* 131*     11.  Hypothyroidism.  Synthroid 12.  Obesity.  BMI 41.50.  Dietary follow-up 13.  OSA.  Nonadherent to CPAP.  Quit smoking 3 years ago.  Continue inhalers. 14. Left posterior headache: start topamax 25mg  HS- improved  15. Nausea and vomiting: add prn zofran.   16.  Freq urination , no dysuria, per  pt has been told by MD that his prostate was enlarged, will start flomax trial- still voiding freq, may take several more days to take full effect   LOS: 4 days A FACE TO FACE EVALUATION WAS PERFORMED  Erick Colacendrew E Evonda Enge 03/29/2021, 10:01 AM

## 2021-03-29 NOTE — Progress Notes (Signed)
Occupational Therapy Session Note  Patient Details  Name: Samuel Norris MRN: [id number] Date of Birth: Nov 18, 1935  Today's Date: 03/30/2021 OT Individual Time: 6283-6629 OT Individual Time Calculation (min): 58 min   Short Term Goals: Week 1:  OT Short Term Goal 1 (Week 1): STG = LTG 2/2 ELOS  Skilled Therapeutic Interventions/Progress Updates:    Pt greeted in the recliner with no c/o pain, agreeable to shower. Started with ambulatory toilet transfer using RW with supervision assistance. Pt with bladder void but unable to move bowels. Supervision for toileting tasks. Transitioned next to shower chair. Supervision for bathing at sit<stand level, OT stabilizing the shower chair while pt propped up each foot to wash. Dressing completed while sitting in the recliner after, sit<stand using RW. Supervision excluding Teds. Pt reports that if he can don Teds at time of d/c then he will wear them, but if he is unable to do this with modified independence, then he will not. Discussed importance of elevating legs in the day for edema control. Also discussed grad day expectations and what to expect on day of d/c. Pt remained sitting in the recliner at close of session, all needs within reach. Tx focus placed on ADL retraining, dynamic balance, functional transfers, and NMR.   Interpretor present during tx  Therapy Documentation Precautions:  Precautions Precautions: Fall Precaution Comments: dizziness Restrictions Weight Bearing Restrictions: No Vital Signs:   ADL: ADL Eating: Independent Where Assessed-Eating: Bed level, Chair Grooming: Modified independent Where Assessed-Grooming: Sitting at sink Upper Body Bathing: Modified independent Where Assessed-Upper Body Bathing: Sitting at sink Lower Body Bathing: Minimal assistance Where Assessed-Lower Body Bathing: Standing at sink Upper Body Dressing: Independent Where Assessed-Upper Body Dressing: Sitting at sink Lower Body Dressing:  Moderate assistance Where Assessed-Lower Body Dressing: Standing at sink Toileting: Minimal assistance Where Assessed-Toileting: Teacher, adult education: Furniture conservator/restorer Method: Proofreader: Engineer, technical sales: Not assessed Film/video editor: Not assessed  Therapy/Group: Individual Therapy  Samuel Norris A Samuel Norris 03/30/2021, 12:24 PM

## 2021-03-30 DIAGNOSIS — I63542 Cerebral infarction due to unspecified occlusion or stenosis of left cerebellar artery: Secondary | ICD-10-CM | POA: Diagnosis not present

## 2021-03-30 LAB — GLUCOSE, CAPILLARY
Glucose-Capillary: 142 mg/dL — ABNORMAL HIGH (ref 70–99)
Glucose-Capillary: 132 mg/dL — ABNORMAL HIGH (ref 70–99)
Glucose-Capillary: 173 mg/dL — ABNORMAL HIGH (ref 70–99)
Glucose-Capillary: 162 mg/dL — ABNORMAL HIGH (ref 70–99)

## 2021-03-30 NOTE — Patient Care Conference (Signed)
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 Claudette Laws Social Worker Present: Lavera Guise, BSW Nurse Present: Chana Bode, RN PT Present: Ernestene Kiel, PT OT Present: Annye English, OT     Current Status/Progress Goal Weekly Team Focus  Bowel/Bladder     Continent   Continent     Swallow/Nutrition/ Hydration             ADL's   ind for UB dressing, S for full-body bathing at shower level, toileting, toilet transfer with RW, min A for LB dressing; pt requires occasional reminders for safety awareness due to impaired balance  mod I bathroom transfers/ ADL  self-care/balance/transfer retraining/IADL retraining; safety awareness, activity tolerance and energy conservation   Mobility   CGA-min assist with gait and transfers with RW. CGA for stair management with BUE support x 12 steps  Mod I gait and transfers with RW. supervision assist for stair management.  improved safety, balance, endurnace. family education.   Communication             Safety/Cognition/ Behavioral Observations            Pain     Pain managed with prn meds   Pain managed with prn meds   Assess need for and effectiveness of medications  Skin     N/A   N/A       Discharge Planning:  d/c home   Team  Discussion: Patient doing well, anticipate short ELOS Patient on target to meet rehab goals: yes  *See Care Plan and progress notes for long and short-term goals.   Revisions to Treatment Plan:  N/A  Teaching Needs: Safety, medications, secondary risk management, transfers, toileting, etc.  Current Barriers to Discharge: Decreased caregiver support and Home enviroment access/layout  Possible Resolutions to Barriers: Family education HH follow up services DME: RW     Medical Summary Current Status: 86 year old male with cerebellar stroke causing balance disorder and dizziness.  Barriers to Discharge: Other (comments);Weight  Barriers to Discharge Comments: High fall risk Possible Resolutions to [employer] Focus: Continue lower body strengthening work on weight shifting and balance, maintain glycemic control and BP control   Continued Need for Acute Rehabilitation Level of Care: The patient requires daily medical management by a physician with specialized training in physical medicine and rehabilitation for the following reasons: Direction of a multidisciplinary physical rehabilitation program to maximize functional independence : Yes Medical management of patient stability for increased activity during participation in an intensive rehabilitation regime.: Yes Analysis of laboratory values and/or radiology reports with any subsequent need for medication adjustment and/or medical intervention. : Yes   I attest that I was present, lead the team conference, and concur with the assessment and plan of the team.   Chana Bode B 03/30/2021, 1:03 PM

## 2021-03-30 NOTE — Progress Notes (Signed)
Patient ID: Samuel Norris, male   DOB: 02-10-1936, 86 y.o.   MRN: [id number]  Rolling Walker ordered through [employer].

## 2021-03-30 NOTE — Progress Notes (Signed)
PROGRESS NOTE   Subjective/Complaints: No complaints this morning. Denies pain, constipation, insomnia.   Asks for coffee Working with OT  ROS- neg CP, SOB, N/V/D, pain   Objective:   No results found. No results for input(s): WBC, HGB, HCT, PLT in the last 72 hours.  No results for input(s): NA, K, CL, CO2, GLUCOSE, BUN, CREATININE, CALCIUM in the last 72 hours.   Intake/Output Summary (Last 24 hours) at 03/30/2021 1126 Last data filed at 03/30/2021 0924 Gross per 24 hour  Intake 1077 ml  Output 325 ml  Net 752 ml        Physical Exam: Vital Signs Blood pressure 127/64, pulse 60, temperature 98.1 F (36.7 C), temperature source Oral, resp. rate 18, height 5\' 7"  (1.702 m), weight 120.2 kg, SpO2 94 %.  Gen: no distress, normal appearing HEENT: oral mucosa pink and moist, NCAT Cardio: Reg rate Chest: normal effort, normal rate of breathing Abd: soft, non-distended Extremities: No clubbing, cyanosis, or edema Skin: No evidence of breakdown, no evidence of rash Neurologic: Cranial nerves II through XII intact, motor strength is 5/5 in bilateral deltoid, bicep, tricep, grip, hip flexor, knee extensors, ankle dorsiflexor and plantar flexor Sensory exam normal sensation to light touch and proprioception in bilateral upper and lower extremities Visual fields intact to confrontation testing  Cerebellar exam normal finger to nose to finger as well as heel to shin in bilateral upper and lower extremities Musculoskeletal: tenderness to palpation midline in lumbar spine  No joint swelling    Assessment/Plan: 1. Functional deficits which require 3+ hours per day of interdisciplinary therapy in a comprehensive inpatient rehab setting. Physiatrist is providing close team supervision and 24 hour management of active medical problems listed below. Physiatrist and rehab team continue to assess barriers to discharge/monitor  patient progress toward functional and medical goals  Care Tool:  Bathing    Body parts bathed by patient: Chest, Abdomen, Left arm, Right arm, Front perineal area, Right upper leg, Left upper leg, Buttocks, Face, Right lower leg, Left lower leg   Body parts bathed by helper: Right lower leg, Left lower leg     Bathing assist Assist Level: Set up assist     Upper Body Dressing/Undressing Upper body dressing   What is the patient wearing?: Pull over shirt    Upper body assist Assist Level: Independent    Lower Body Dressing/Undressing Lower body dressing      What is the patient wearing?: Pants     Lower body assist Assist for lower body dressing: Set up assist     Toileting Toileting    Toileting assist Assist for toileting: Supervision/Verbal cueing     Transfers Chair/bed transfer  Transfers assist  Chair/bed transfer activity did not occur: Safety/medical concerns  Chair/bed transfer assist level: Supervision/Verbal cueing     Locomotion Ambulation   Ambulation assist      Assist level: Minimal Assistance - Patient > 75% Assistive device: No Device Max distance: 85ft   Walk 10 feet activity   Assist     Assist level: Maximal Assistance - Patient 25 - 49%     Walk 50 feet activity   Assist Walk 50  feet with 2 turns activity did not occur: Safety/medical concerns         Walk 150 feet activity   Assist Walk 150 feet activity did not occur: Safety/medical concerns         Walk 10 feet on uneven surface  activity   Assist Walk 10 feet on uneven surfaces activity did not occur: Safety/medical concerns         Wheelchair     Assist Is the patient using a wheelchair?: Yes Type of Wheelchair: Manual    Wheelchair assist level: Minimal Assistance - Patient > 75% Max wheelchair distance: 150    Wheelchair 50 feet with 2 turns activity    Assist        Assist Level: Minimal Assistance - Patient > 75%    Wheelchair 150 feet activity     Assist      Assist Level: Minimal Assistance - Patient > 75%   Blood pressure 127/64, pulse 60, temperature 98.1 F (36.7 C), temperature source Oral, resp. rate 18, height 5\' 7"  (1.702 m), weight 120.2 kg, SpO2 94 %.  Medical Problem List and Plan: 1. 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  Antithrombotics: -DVT/anticoagulation:  Pharmaceutical: Lovenox             -antiplatelet therapy: Aspirin 325 mg daily and Plavix 75 mg daily x3 months then Plavix alone as noted by neurology services 3. Lumbar strain Muscle spasm: continue Flexeril as needed, trial lidoderm patch  4.Insomnia: resolved, continue Melatonin as needed.  Provide emotional support             -antipsychotic agents: N/A 5. Neuropsych: This patient is capable of making decisions on his own behalf. 6. Skin/Wound Care: Routine skin checks 7. Fluids/Electrolytes/Nutrition: Routine in and outs with follow-up chemistries 8.  Hypertension.  Continue Cozaar 100 mg daily.  Monitor with increased mobility Vitals:   03/30/21 0351 03/30/21 0743  BP: (!) 119/46 127/64  Pulse: (!) 55 60  Resp: 18   Temp: 98.1 F (36.7 C)   SpO2: 93% 94%   Early am systolic elevation but controlled during the day , will not add additional BP meds to avoid hypotension during the day  9.  Hyperlipidemia.  Lipitor 10.  Diabetes mellitus.  Hemoglobin A1c 8.4.  Tradjenta 5mg  daily.  Check blood sugars before meals and at bedtime CBG (last 3)  Recent Labs    03/29/21 1647 03/29/21 2100 03/30/21 0615  GLUCAP 190* 164* 142*    11.  Hypothyroidism.  Synthroid 12.  Obesity.  BMI 41.50.  Dietary follow-up 13.  OSA.  Nonadherent to CPAP.  Quit smoking 3 years ago.  Continue inhalers. 14. Left  posterior headache: start topamax 25mg  HS- improved  15. Nausea and vomiting: add prn zofran.   16.  Freq urination , no dysuria, per pt has been told by MD that his prostate was enlarged, will start flomax trial- still voiding freq, may take several more days to take full effect   LOS: 5 days A FACE TO FACE EVALUATION WAS PERFORMED  03/31/21 P Samuel Norris 03/30/2021, 11:26 AM

## 2021-03-30 NOTE — Discharge Summary (Signed)
Physician Discharge Summary  Patient ID: Samuel Norris MRN: [id number] DOB/AGE: 10/15/1935 86 y.o.  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 Acute left cerebellar infarct, abnormal distal left vertebral artery. EXAM: CT ANGIOGRAPHY HEAD AND NECK TECHNIQUE: Multidetector CT imaging of the head and neck was performed using the standard protocol during bolus administration of intravenous contrast. Multiplanar CT image reconstructions and MIPs were obtained to evaluate the vascular anatomy. Carotid stenosis measurements (when applicable) are obtained utilizing NASCET criteria, using the distal internal carotid diameter as the denominator. CONTRAST:  75mL OMNIPAQUE IOHEXOL 350 MG/ML SOLN COMPARISON:  Brain MRI and intracranial MRA yesterday. Head CT yesterday. FINDINGS: CT HEAD Brain: Cytotoxic edema now visible in the medial left cerebellum. No associated hemorrhage. No posterior fossa mass effect. Superimposed small chronic left cerebellar infarct. Chronic encephalomalacia medial right PCA territory. Stable gray-white matter differentiation elsewhere. No midline shift, ventriculomegaly, mass effect, evidence of mass lesion, intracranial hemorrhage or new cortically based acute infarction. Calvarium and skull base: Negative. Paranasal sinuses: Visualized paranasal sinuses and mastoids are stable and well aerated. Orbits: No acute orbit or scalp soft tissue finding. CTA NECK Skeleton: Absent dentition. Multilevel cervical spine degeneration. No acute osseous abnormality identified.  Upper chest: Superior mediastinal lipomatosis. No superior mediastinal lymphadenopathy. Mild mosaic attenuation in the upper lungs likely mild atelectasis and gas trapping. Atelectatic changes to the trachea at the thoracic inlet. Other neck: Retropharyngeal course of both carotid arteries (normal variant). No acute neck soft tissue finding. Aortic arch: 3 vessel arch configuration. Mild calcified arch atherosclerosis. Right carotid system: Tortuous brachiocephalic artery and proximal right CCA without plaque or stenosis. Retropharyngeal course of the right carotid. Minor calcified plaque at the posterior right ICA origin without stenosis. Tortuous right ICA distal to the bulb. Left carotid system: Tortuous proximal left CCA and retropharyngeal course without stenosis. Negative left carotid bifurcation. No stenosis. Vertebral arteries: Calcified plaque and tortuosity in the proximal right subclavian artery without hemodynamically significant stenosis. Normal right vertebral artery origin. Right vertebral artery is patent and normal to the skull base. Proximal left subclavian artery is patent with mild ectasia and minimal atherosclerosis. Left vertebral artery is occluded just distal to its origin in the V1 segment (series 18, image 534). No left vertebral artery reconstitution to the skull base. CTA HEAD Posterior circulation: Right vertebral V4 segment is patent to the basilar without stenosis. Right AICA appears dominant. There is partial retrograde reconstitution of the left vertebral V4 segment and the left PICA origin remains patent. Patent basilar artery without stenosis. AICA, SCA and PCA origins are patent. Posterior communicating arteries are diminutive or absent. Bilateral PCA branches are patent, but there is a short segment mild to moderate stenosis of the right P2 on series 14, image 27. Anterior circulation: Both ICA siphons are patent and mildly tortuous. Minimal siphon plaque. No ICA siphon stenosis.  Patent carotid termini, MCA and ACA origins with mild tortuosity. Diminutive or absent anterior communicating artery. Bilateral MCA and ACA branches are patent with mild tortuosity and no significant stenosis. Venous sinuses: Patent. Anatomic variants: None. Review of the MIP images confirms the above findings IMPRESSION: 1. Positive for occluded Left Vertebral Artery just beyond its  origin. Only partial reconstituted V4 segment but the left PICA origin appears to remain patent. 2. Mild to moderate right PCA P2 segment stenosis. But no other significant arterial stenosis in the head or neck. Generalized arterial tortuosity. Retrograde course of the carotids. 3. Expected CT appearance of the Left cerebellar infarct. No associated hemorrhage or mass effect. 4. No new intracranial abnormality. These results were communicated to Dr. Roda ShuttersXu at 10:31 am on 03/22/2021 by text page via the [facility] messaging system. Electronically Signed   By: Odessa FlemingH  Hall M.D.   On: 03/22/2021 10:35   CT HEAD WO CONTRAST  Result Date: 03/21/2021 CLINICAL DATA:  Neuro deficit, acute, stroke suspected Patient reports sudden onset of headache and dizziness. EXAM: CT HEAD WITHOUT CONTRAST TECHNIQUE: Contiguous axial images were obtained from the base of the skull through the vertex without intravenous contrast. COMPARISON:  None. FINDINGS: Brain: Focal area of low-density and loss of gray-white differentiation in the right occipital lobe, series 3, image 18 and series 5, image 49. No acute intracranial hemorrhage. Generalized atrophy, normal for age. Mild periventricular white matter hypodensity typical of chronic small vessel ischemia. No subdural or extra-axial collection. No hydrocephalus. Basilar cisterns are patent. Vascular: No hyperdense vessel or unexpected calcification. Skull: No fracture or focal lesion. Sinuses/Orbits: Trace mucosal thickening of maxillary sinuses. No sinus fluid levels. No mastoid effusion. Bilateral cataract resection.  Other: Small left frontal scalp lipoma. IMPRESSION: 1. Focal area of low-density and loss of gray-white differentiation in the right occipital lobe, suspicious for infarct, likely subacute. Consider further evaluation with MRI. 2. Age related atrophy and chronic small vessel ischemia. Electronically Signed   By: Narda RutherfordMelanie  Sanford M.D.   On: 03/21/2021 17:19   MR ANGIO HEAD WO CONTRAST  Result Date: 03/21/2021 CLINICAL DATA:  Acute neurologic deficit EXAM: MRI HEAD WITHOUT CONTRAST MRA HEAD WITHOUT CONTRAST TECHNIQUE: Multiplanar, multi-echo pulse sequences of the brain and surrounding structures were acquired without intravenous contrast. Angiographic images of the Circle of Willis were acquired using MRA technique without intravenous contrast. COMPARISON:  No pertinent prior exam. FINDINGS: MRI HEAD FINDINGS Brain: Small area of acute ischemia involving the inferior medial left cerebellar hemisphere. No acute or chronic hemorrhage. Normal white matter signal. Generalized volume loss without a clear lobar predilection. The midline structures are normal. Skull and upper cervical spine: Normal calvarium and skull base. Visualized upper cervical spine and soft tissues are normal. Sinuses/Orbits:No paranasal sinus fluid levels or advanced mucosal thickening. No mastoid or middle ear effusion. Normal orbits. MRA HEAD FINDINGS POSTERIOR CIRCULATION: --Vertebral arteries: Diminished flow related enhancement of the left vertebral artery V4 segment. Normal right V4. --Inferior cerebellar arteries: Normal. --Basilar artery: Normal. --Superior cerebellar arteries: Normal. --Posterior cerebral arteries: Normal. ANTERIOR CIRCULATION: --Intracranial internal carotid arteries: Normal. --Anterior cerebral arteries (ACA): Normal. --Middle cerebral arteries (MCA): Normal. ANATOMIC VARIANTS: None IMPRESSION: 1. Small acute infarct of the inferior medial left cerebellar hemisphere. No hemorrhage or mass effect. 2. Diminished flow  related enhancement of the left vertebral artery V4 segment, consistent with high-grade stenosis. 3. Otherwise normal intracranial MRA. Electronically Signed   By: Deatra RobinsonKevin  Herman M.D.   On: 03/21/2021 21:26   MR BRAIN WO CONTRAST  Result Date: 03/21/2021 CLINICAL DATA:  Acute neurologic deficit EXAM: MRI HEAD WITHOUT CONTRAST MRA HEAD WITHOUT CONTRAST TECHNIQUE: Multiplanar, multi-echo pulse sequences of the brain and surrounding structures were acquired without intravenous contrast. Angiographic images of the Circle of Willis were acquired using MRA technique without intravenous contrast. COMPARISON:  No pertinent prior exam.  FINDINGS: MRI HEAD FINDINGS Brain: Small area of acute ischemia involving the inferior medial left cerebellar hemisphere. No acute or chronic hemorrhage. Normal white matter signal. Generalized volume loss without a clear lobar predilection. The midline structures are normal. Skull and upper cervical spine: Normal calvarium and skull base. Visualized upper cervical spine and soft tissues are normal. Sinuses/Orbits:No paranasal sinus fluid levels or advanced mucosal thickening. No mastoid or middle ear effusion. Normal orbits. MRA HEAD FINDINGS POSTERIOR CIRCULATION: --Vertebral arteries: Diminished flow related enhancement of the left vertebral artery V4 segment. Normal right V4. --Inferior cerebellar arteries: Normal. --Basilar artery: Normal. --Superior cerebellar arteries: Normal. --Posterior cerebral arteries: Normal. ANTERIOR CIRCULATION: --Intracranial internal carotid arteries: Normal. --Anterior cerebral arteries (ACA): Normal. --Middle cerebral arteries (MCA): Normal. ANATOMIC VARIANTS: None IMPRESSION: 1. Small acute infarct of the inferior medial left cerebellar hemisphere. No hemorrhage or mass effect. 2. Diminished flow related enhancement of the left vertebral artery V4 segment, consistent with high-grade stenosis. 3. Otherwise normal intracranial MRA. Electronically Signed    By: Deatra Robinson M.D.   On: 03/21/2021 21:26   ECHOCARDIOGRAM COMPLETE  Result Date: 03/23/2021    ECHOCARDIOGRAM REPORT   Patient Name:   KAMAU WEATHERALL Date of Exam: 03/23/2021 Medical Rec #:  [id number]          Height:       67.0 in Accession #:    [id number]         Weight:       265.0 lb Date of Birth:  06/12/1935          BSA:          2.279 m Patient Age:    85 years           BP:           162/85 mmHg Patient Gender: M                  HR:           65 bpm. Exam Location:  Inpatient Procedure: 2D Echo Indications:    Stroke  History:        Patient has prior history of Echocardiogram examinations, most                 recent 10/23/2020. Risk Factors:Hypertension and Diabetes.  Sonographer:    Eduard Roux Referring Phys: [phone] JARED M GARDNER IMPRESSIONS  1. Left ventricular ejection fraction, by estimation, is 60 to 65%. The left ventricle has normal function. The left ventricle has no regional wall motion abnormalities. There is mild left ventricular hypertrophy. Left ventricular diastolic parameters are consistent with Grade I diastolic dysfunction (impaired relaxation).  2. Right ventricular systolic function is normal. The right ventricular size is normal. Tricuspid regurgitation signal is inadequate for assessing PA pressure.  3. Left atrial size was mildly dilated.  4. The mitral valve is normal in structure. No evidence of mitral valve regurgitation. No evidence of mitral stenosis.  5. The aortic valve is tricuspid. Aortic valve regurgitation is not visualized. Aortic valve sclerosis/calcification is present, without any evidence of aortic stenosis.  6. Aortic dilatation noted. There is mild dilatation of the ascending aorta, measuring 39 mm. FINDINGS  Left Ventricle: Left ventricular ejection fraction, by estimation, is 60 to 65%. The left ventricle has normal function. The left ventricle has no regional wall motion abnormalities. The left ventricular internal cavity size was normal in size.  There is  mild left ventricular hypertrophy. Left ventricular diastolic parameters are consistent with  Grade I diastolic dysfunction (impaired relaxation). Right Ventricle: The right ventricular size is normal. Right vetricular wall thickness was not well visualized. Right ventricular systolic function is normal. Tricuspid regurgitation signal is inadequate for assessing PA pressure. Left Atrium: Left atrial size was mildly dilated. Right Atrium: Right atrial size was normal in size. Pericardium: There is no evidence of pericardial effusion. Mitral Valve: The mitral valve is normal in structure. No evidence of mitral valve regurgitation. No evidence of mitral valve stenosis. Tricuspid Valve: The tricuspid valve is normal in structure. Tricuspid valve regurgitation is trivial. Aortic Valve: The aortic valve is tricuspid. Aortic valve regurgitation is not visualized. Aortic valve sclerosis/calcification is present, without any evidence of aortic stenosis. Aortic valve peak gradient measures 8.8 mmHg. Pulmonic Valve: The pulmonic valve was not well visualized. Pulmonic valve regurgitation is not visualized. Aorta: The aortic root is normal in size and structure and aortic dilatation noted. There is mild dilatation of the ascending aorta, measuring 39 mm. IAS/Shunts: The interatrial septum was not well visualized.  LEFT VENTRICLE PLAX 2D LVIDd:         4.77 cm   Diastology LVIDs:         3.33 cm   LV e' medial:    5.55 cm/s LV PW:         1.15 cm   LV E/e' medial:  12.2 LV IVS:        1.15 cm   LV e' lateral:   7.83 cm/s LVOT diam:     2.20 cm   LV E/e' lateral: 8.7 LV SV:         78 LV SV Index:   34 LVOT Area:     3.80 cm  RIGHT VENTRICLE RV Basal diam:  2.93 cm RV S prime:     13.40 cm/s TAPSE (M-mode): 3.7 cm LEFT ATRIUM             Index        RIGHT ATRIUM           Index LA diam:        4.00 cm 1.76 cm/m   RA Area:     17.50 cm 7.68 cm/m LA Vol (A2C):   85.3 ml 37.43 ml/m LA Vol (A4C):   66.5 ml 29.18 ml/m  LA Biplane Vol: 78.0 ml 34.22 ml/m  AORTIC VALVE                 PULMONIC VALVE AV Area (Vmax): 2.64 cm     PV Vmax:       0.94 m/s AV Vmax:        148.50 cm/s  PV Peak grad:  3.5 mmHg AV Peak Grad:   8.8 mmHg LVOT Vmax:      103.00 cm/s LVOT Vmean:     61.000 cm/s LVOT VTI:       0.204 m  AORTA Ao Root diam: 3.80 cm Ao Asc diam:  3.90 cm MITRAL VALVE MV Area (PHT): 3.34 cm    SHUNTS MV Decel Time: 227 msec    Systemic VTI:  0.20 m MV E velocity: 67.90 cm/s  Systemic Diam: 2.20 cm MV A velocity: 91.80 cm/s MV E/A ratio:  0.74 Epifanio Lesches MD Electronically signed by Epifanio Lesches MD Signature Date/Time: 03/23/2021/11:43:01 AM    Final     Labs:  Basic Metabolic Panel: Recent Labs  Lab 03/25/21 1910 03/26/21 0508  NA  --  137  K  --  4.0  CL  --  103  CO2  --  26  GLUCOSE  --  143*  BUN  --  14  CREATININE 1.01 1.10  CALCIUM  --  8.7*    CBC: Recent Labs  Lab 03/25/21 1910 03/26/21 0508  WBC 10.0 9.2  NEUTROABS  --  5.3  HGB 15.0 15.0  HCT 45.4 45.4  MCV 91.0 90.8  PLT 200 197    CBG: Recent Labs  Lab 03/30/21 2032 03/31/21 0546 03/31/21 1147 03/31/21 1649 03/31/21 2047  GLUCAP 162* 146* 141* 194* 215*   Family history.  Father with CVA.  Denies any colon cancer esophageal cancer or rectal cancer  Brief HPI:   Donato Studley is a 86 y.o. right-handed male with history of hypertension hyperlipidemia diabetes mellitus, morbid obesity, OSA nonadherent to CPAP and quit smoking 3 years ago.  Per chart review lives with daughter.  Two-level home 2 steps to entry.  Presented 03/21/2021 with headache nausea vomiting as well as unsteady gait.  Cranial CT scan showed focal area of low density in loss of gray-white differentiation in the right occipital lobe suspicious for infarction likely subacute.  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  No hemorrhage or mass-effect.  Diminished flow related enhancement of the left vertebral  artery V4 segment consistent with high-grade stenosis otherwise normal intracranial MRA.  Admission chemistries unremarkable except glucose 254 AST 46 ALT 63 troponin 21 hemoglobin A1c 8.4.  Echocardiogram with ejection fraction of 60 to 65% no wall motion abnormalities grade 1 diastolic dysfunction.  Maintained on aspirin 325 mg daily and Plavix 75 mg day x3 months then Plavix alone.  Subcutaneous Lovenox for DVT prophylaxis.  Therapy evaluations completed due to patient decreased functional mobility was admitted for a comprehensive rehab program.   Hospital Course: Blaike Vickers was admitted to rehab 03/25/2021 for inpatient therapies to consist of PT, ST and OT at least three hours five days a week. Past admission physiatrist, therapy team and rehab RN have worked together to provide customized collaborative inpatient rehab.  Pertaining to patient's left cerebellar PICA infarction remained stable he will continue aspirin 325 mg daily and Plavix 75 mg day x3 months then Plavix alone.  No bleeding episodes.  Subcutaneous Lovenox for DVT prophylaxis.  Hospital course lumbar strain muscle spasm maintained on Flexeril as well as trial of Lidoderm patch.  Blood pressure controlled on Cozaar will need outpatient follow-up.  Lipitor ongoing for hyperlipidemia.  Blood sugars controlled hemoglobin A1c 8.4 patient remained on Tradjenta with diabetic teaching.  Synthroid ongoing for hypothyroidism.  History of obesity BMI 41.50 dietary follow-up.  OSA nonadherent to CPAP monitoring of oxygen saturations.  Left posterior headache placed on low-dose Topamax with good results.  Bouts of urinary frequency patient remained on Flomax.     Blood pressures were monitored on TID basis and soft and monitored  Diabetes has been monitored with ac/hs CBG checks and SSI was use prn for tighter BS control.    Rehab course: During patient's stay in rehab weekly team conferences were held to monitor patient's progress, set  goals and discuss barriers to discharge. At admission, patient required minimal assist 120 feet rolling walker minimal assist sit to stand  Physical exam.  Blood pressure 134/61 pulse 62 temperature 98.6 respirations 19 oxygen saturations 100% room air Constitutional.  No acute distress HEENT Head.  Normocephalic and atraumatic Eyes.  Pupils round and reactive to light no discharge.nystagmus Neck.  Supple nontender no JVD without thyromegaly Cardiac regular rate and  rhythm any extra sounds or murmur heard Abdomen.  Soft nontender positive bowel sounds without rebound Respiratory effort normal no respiratory distress without wheeze Skin.  Warm and dry Neurologic.  Alert oriented x3 no acute distress follows commands.  Fair insight.  Left side ataxia.  Strength and sensation intact  He/She  has had improvement in activity tolerance, balance, postural control as well as ability to compensate for deficits. He/She has had improvement in functional use RUE/LUE  and RLE/LLE as well as improvement in awareness.  Ambulates to the toilet with supervision assist for safety rolling walker.  Ambulates up to 150 feet.  Stair management contact-guard.  Car transfer training rolling walker supervision.  Patient can gather belongings for ADLs.  Completed full body bathing all sit to stand level with supervision.  Full family teaching completed plan discharged to home       Disposition: Discharged to home    Diet: Diabetic diet  Special Instructions: No driving smoking or alcohol  Continue aspirin 325 mg daily and Plavix 75 mg day x3 months then Plavix alone  Medications at discharge 1.  Tylenol as needed 2.  Aspirin 325 mg daily until 06/24/2021 3.  Lipitor 80 mg nightly 4.  Plavix 75 mg daily 5.  Flexeril 10 mg every 8 hours as needed muscle spasms 6.Fluorometholone 0.1% ophthalmic solution 1 drop both eyes daily 7.  Synthroid 88 mcg p.o. daily 8.  Tradjenta 5 mg p.o. daily 9.  Claritin 10 mg  daily as needed 10.  Cozaar 100 mg p.o. daily 11.  Symbicort 2 puffs twice daily 12.  Prilosec 40 mg p.o. daily 13.  Flomax 0.4 mg daily 14.  Topamax 25 mg p.o. nightly  30-35 minutes were spent completing discharge summary and discharge planning  Discharge Instructions     Ambulatory referral to Neurology   Complete by: As directed    An appointment is requested in approximately: 4 weeks left cerebellar PICA infarction   Ambulatory referral to Physical Medicine Rehab   Complete by: As directed    Moderate complexity follow-up 1 to 2 weeks left cerebellar PICA infarction        Follow-up Information     Kirsteins, Victorino SparrowAndrew E, MD Follow up.   Specialty: Physical Medicine and Rehabilitation Why: Office to call for appointment Contact information: [address] [place] [address] [phone]                 Signed: Charlton AmorDaniel J Mazella Deen 04/01/2021, 5:03 AM

## 2021-03-30 NOTE — Progress Notes (Signed)
Physical Therapy Session Note  Patient Details  Name: Samuel Norris MRN: [id number] Date of Birth: 12/06/1935  Today's Date: 03/30/2021 PT Individual Time: 0905-1000 and 5427-0623 PT Individual Time Calculation (min): 55 min and 70 min  Short Term Goals: Week 1:  PT Short Term Goal 1 (Week 1): STG=LTG due to ELOS  Skilled Therapeutic Interventions/Progress Updates:  Session 1: Patient seated upright in recliner on entrance to room. Patient alert and agreeable to PT session. In person interpreter present during entire session  Patient with no pain complaint throughout session.  Therapeutic Activity: Transfers: Patient performed sit<>stand and stand pivot transfers throughout session with distant supervision. Dizziness noted with first rise to stand. Reminded pt to stay standing to see if dizziness subsides. Pt will be considered for mod I in room over course of today and made mod I in morning tomorrow after assessment. Toilet transfer performed with Mod I and pt able to manage pericare and clothing with IND.    Gait Training:  Patient ambulated 200 ft using RW with close/ distant supervision. Demonstrated continued bil ER d/t body habitus. Provided vc/ tc for slow pace to conserve energy and reach further distances prior to fatigue onset.  Pt guided in stair training and is able to complete 8 steps x2 with supervision using BHR. Pt relates that he has BHR for most of stair well and then has walls on both sides that he can put his hands on for balance.  Pt requires seated rest break between for fatigue.   Neuromuscular Re-ed: NMR facilitated during session with focus onstanding balance. Pt guided in aspects of Berg Balance testing with focus on dynamic stepping  and reaching. NMR performed for improvements in motor control and coordination, balance, sequencing, judgement, and self confidence/ efficacy in performing all aspects of mobility at highest level of independence.   Patient  seated upright  in recliner at end of session with brakes locked, seat pad alarm set, and all needs within reach.     Session 2:  Patient seated in recliner on entrance to room. Patient alert and agreeable to PT session.   Patient with no pain complaint throughout session.  Therapeutic Activity: Transfers: Patient performed sit<>stand and stand pivot transfers throughout session with Mod I. No signs of dizziness this afternoon.   Gait Training:  Patient ambulated 300 ft x2 as well as several distances within room using AD with close supervision. Short distances in therapy gym without AD and close supervision. Pt guided in collection of items from about therapy gym with no AD and CGA/ supervision. Demonstrated increased lateral sway with no AD. Provided vc/ tc for not rushing when fatigued, increasing step height to decrease lean.  Neuromuscular Re-ed: NMR facilitated during session with focus on standing balance. Pt guided in aspects of berg Balance testing with focus on NBOS and static stance with reaching outside of BOS. NMR performed for improvements in motor control and coordination, balance, sequencing, judgement, and self confidence/ efficacy in performing all aspects of mobility at highest level of independence.   Patient seated in recliner with BLE elevated at end of session with brakes locked, seat pad alarm set, and all needs within reach.   Therapy Documentation Precautions:  Precautions Precautions: Fall Precaution Comments: dizziness Restrictions Weight Bearing Restrictions: No General:   Vital Signs: Therapy Vitals Pulse Rate: 60 BP: 127/64 Oxygen Therapy SpO2: 94 % O2 Device: Room Air Pain: Pain Assessment Pain Scale: 0-10 Pain Score: 0-No pain  Therapy/Group: Individual Therapy  Zenaida Deed  Dorma Russell PT, DPT 03/30/2021, 10:10 AM

## 2021-03-30 NOTE — Progress Notes (Signed)
Inpatient Rehabilitation Discharge Medication Review by a Pharmacist  A complete drug regimen review was completed for this patient to identify any potential clinically significant medication issues.  High Risk Drug Classes Is patient taking? Indication by Medication  Antipsychotic No   Anticoagulant No   Antibiotic No   Opioid No   Antiplatelet Yes Aspirin and Plavix for CVA  Hypoglycemics/insulin Yes Tradjenta for DM  Vasoactive Medication Yes Cozaar for BP  Chemotherapy No   Other Yes Lipitor for HLD Flomax for BPH Topamax for HAs Synthroid for low thyroid Dulera for SOB     Type of Medication Issue Identified Description of Issue Recommendation(s)  Drug Interaction(s) (clinically significant)     Duplicate Therapy     Allergy     No Medication Administration End Date     Incorrect Dose     Additional Drug Therapy Needed     Significant med changes from prior encounter (inform family/care partners about these prior to discharge).    Other       Clinically significant medication issues were identified that warrant physician communication and completion of prescribed/recommended actions by midnight of the next day:  No    Pharmacist comments: None  Time spent performing this drug regimen review (minutes):  20 minutes   Elwin Sleight 03/30/2021 10:47 AM

## 2021-03-31 ENCOUNTER — Other Ambulatory Visit (HOSPITAL_COMMUNITY): Payer: Self-pay

## 2021-03-31 DIAGNOSIS — I63542 Cerebral infarction due to unspecified occlusion or stenosis of left cerebellar artery: Secondary | ICD-10-CM | POA: Diagnosis not present

## 2021-03-31 LAB — GLUCOSE, CAPILLARY
Glucose-Capillary: 146 mg/dL — ABNORMAL HIGH (ref 70–99)
Glucose-Capillary: 141 mg/dL — ABNORMAL HIGH (ref 70–99)
Glucose-Capillary: 194 mg/dL — ABNORMAL HIGH (ref 70–99)
Glucose-Capillary: 215 mg/dL — ABNORMAL HIGH (ref 70–99)

## 2021-03-31 MED ORDER — ASPIRIN 325 MG PO TBEC
DELAYED_RELEASE_TABLET | ORAL | 0 refills | Status: AC
Start: 2021-03-31 — End: ?
  Filled 2021-03-31: qty 30, 30d supply, fill #0

## 2021-03-31 MED ORDER — LINAGLIPTIN 5 MG PO TABS
5.0000 mg | ORAL_TABLET | Freq: Every day | ORAL | 0 refills | Status: AC
Start: 2021-04-01 — End: ?
  Filled 2021-03-31: qty 30, 30d supply, fill #0

## 2021-03-31 MED ORDER — CLOPIDOGREL BISULFATE 75 MG PO TABS
75.0000 mg | ORAL_TABLET | Freq: Every day | ORAL | 0 refills | Status: AC
Start: 2021-03-31 — End: ?
  Filled 2021-03-31: qty 90, 90d supply, fill #0

## 2021-03-31 MED ORDER — LORATADINE 10 MG PO TABS
10.0000 mg | ORAL_TABLET | Freq: Every day | ORAL | 0 refills | Status: AC | PRN
Start: 2021-03-31 — End: ?
  Filled 2021-03-31: qty 30, 30d supply, fill #0

## 2021-03-31 MED ORDER — ACETAMINOPHEN 325 MG PO TABS
650.0000 mg | ORAL_TABLET | ORAL | Status: AC | PRN
Start: 2021-03-31 — End: ?

## 2021-03-31 MED ORDER — LEVOTHYROXINE SODIUM 88 MCG PO TABS
88.0000 ug | ORAL_TABLET | Freq: Every day | ORAL | 0 refills | Status: AC
Start: 2021-03-31 — End: ?
  Filled 2021-03-31: qty 30, 30d supply, fill #0

## 2021-03-31 MED ORDER — TAMSULOSIN HCL 0.4 MG PO CAPS
0.4000 mg | ORAL_CAPSULE | Freq: Every day | ORAL | 0 refills | Status: AC
Start: 2021-03-31 — End: ?
  Filled 2021-03-31: qty 30, 30d supply, fill #0

## 2021-03-31 MED ORDER — LOSARTAN POTASSIUM 100 MG PO TABS
100.0000 mg | ORAL_TABLET | Freq: Every day | ORAL | 0 refills | Status: AC
Start: 2021-03-31 — End: ?
  Filled 2021-03-31: qty 30, 30d supply, fill #0

## 2021-03-31 MED ORDER — ATORVASTATIN CALCIUM 80 MG PO TABS
80.0000 mg | ORAL_TABLET | Freq: Every day | ORAL | 0 refills | Status: AC
Start: 2021-03-31 — End: ?
  Filled 2021-03-31: qty 30, 30d supply, fill #0

## 2021-03-31 MED ORDER — OMEPRAZOLE 40 MG PO CPDR
40.0000 mg | DELAYED_RELEASE_CAPSULE | Freq: Every day | ORAL | 0 refills | Status: AC
Start: 2021-03-31 — End: ?
  Filled 2021-03-31: qty 30, 30d supply, fill #0

## 2021-03-31 MED ORDER — TOPIRAMATE 25 MG PO TABS
25.0000 mg | ORAL_TABLET | Freq: Every day | ORAL | 0 refills | Status: AC
  Filled 2021-03-31: qty 30, 30d supply, fill #0

## 2021-03-31 MED ORDER — BUDESONIDE-FORMOTEROL FUMARATE 80-4.5 MCG/ACT IN AERO
2.0000 | INHALATION_SPRAY | Freq: Two times a day (BID) | RESPIRATORY_TRACT | 12 refills | Status: AC
Start: 2021-03-31 — End: ?
  Filled 2021-03-31: qty 10.2, 30d supply, fill #0

## 2021-03-31 MED ORDER — CYCLOBENZAPRINE HCL 10 MG PO TABS
10.0000 mg | ORAL_TABLET | Freq: Three times a day (TID) | ORAL | 0 refills | Status: DC | PRN
  Filled 2021-03-31: qty 30, 10d supply, fill #0

## 2021-03-31 NOTE — Progress Notes (Signed)
Physical Therapy Session Note  Patient Details  Name: Samuel Norris MRN: [id number] Date of Birth: 1935-07-14  Today's Date: 03/31/2021 PT Individual Time: 0805-0917 PT Individual Time Calculation (min): 72 min   Short Term Goals: Week 1:  PT Short Term Goal 1 (Week 1): STG=LTG due to ELOS  Skilled Therapeutic Interventions/Progress Updates:  Patient seated upright in recliner on entrance to room. Patient alert and agreeable to PT session. In person interpreter, Lissa Hoard, present throughout entire session.   Patient with no pain complaint throughout session.  Therapeutic Activity: Bed Mobility: Patient performed supine <> sit with Mod I and no use of bedrails with bed lowered to lowest height as pt relates that bed at home is very low. No cueing required to complete.  Transfers: Patient performed sit<>stand and stand pivot transfers throughout session with Mod I. Toilet transfer performed with Mod I. Pt made Mod I in room with use of RW. Pt reminded to use RW and to take time with all mobility to increase safety and balance. Car transfer demonstrated to pt as stand pivot instead of picking up L foot to enter as pt would have done prior to stroke and explanation provided re: need for improved safety and time for motor planning/ coordination to improve. Pt demos understanding. Pt with questions re: getting back to driving. Educated pt that reaction time is currently affected so it will take a while to get back to safe driving. With MD rounding, question of driving presented to Dr. Kirtland Bouchard who related that it will take several more weeks but that with follow up at outpatient office, Dr. Kirtland Bouchard will continue to assess and address in order to continue pt's independence.   Gait Training:  Patient ambulated >300' x2 into/ out of elevator using RW with supervision. Provided vc/ tc for taking time in order to conserve energy and reach further distances. Pt demos ability to ambulate in room with Mod I using  RW.  Neuromuscular Re-ed: NMR facilitated during session with focus on standing balance and coordination. Pt guided in completion of Berg Balance test and completion of TUG. See d/c for Berg results. Pt has most difficulty with more dynamic aspects of test as well as NBOS. Pt completes TUG without use of RW with average time of 24 seconds. With RW pt completes in 21 seconds. (A score of >13.5 seconds indicates patient is at a high fall risk. Pt educated on interpretation of their score)  NMR performed for improvements in motor control and coordination, balance, sequencing, judgement, and self confidence/ efficacy in performing all aspects of mobility at highest level of independence.   Patient seated upright in reliner at end of session with brakes locked, no alarm set, and all needs within reach. Hand off to OT for next session and discussion of Mod I in room with signage to be posted as long as OT is in agreement following session.      Therapy Documentation Precautions:  Precautions Precautions: Fall Precaution Comments: dizziness Restrictions Weight Bearing Restrictions: No General:   Pain: Pain Assessment Pain Scale: 0-10 Pain Score: 0-No pain  Therapy/Group: Individual Therapy  Loel Dubonnet PT, DPT 03/31/2021, 10:23 AM

## 2021-03-31 NOTE — Plan of Care (Signed)
Problem: RH Tub/Shower Transfers Goal: LTG Patient will perform tub/shower transfers w/assist (OT) Description: LTG: Patient will perform tub/shower transfers with assist, with/without cues using equipment (OT) Outcome: Not Met (add Reason) Flowsheets (Taken 03/31/2021 1437) LTG: Pt will perform tub/shower stall transfers with assistance level of: (Requires CGA to complete due to difficulty raising BLE over tub.) --   Problem: RH Furniture Transfers Goal: LTG Patient will perform furniture transfers w/assist (OT/PT) Description: LTG: Patient will perform furniture transfers  with assistance (OT/PT). Outcome: Completed/Met   Problem: RH Balance Goal: LTG Patient will maintain dynamic standing with ADLs (OT) Description: LTG:  Patient will maintain dynamic standing balance with assist during activities of daily living (OT)  Outcome: Completed/Met   Problem: Sit to Stand Goal: LTG:  Patient will perform sit to stand in prep for activites of daily living with assistance level (OT) Description: LTG:  Patient will perform sit to stand in prep for activites of daily living with assistance level (OT) Outcome: Completed/Met   Problem: RH Grooming Goal: LTG Patient will perform grooming w/assist,cues/equip (OT) Description: LTG: Patient will perform grooming with assist, with/without cues using equipment (OT) Outcome: Completed/Met   Problem: RH Bathing Goal: LTG Patient will bathe all body parts with assist levels (OT) Description: LTG: Patient will bathe all body parts with assist levels (OT) Outcome: Completed/Met   Problem: RH Dressing Goal: LTG Patient will perform lower body dressing w/assist (OT) Description: LTG: Patient will perform lower body dressing with assist, with/without cues in positioning using equipment (OT) Outcome: Completed/Met   Problem: RH Toileting Goal: LTG Patient will perform toileting task (3/3 steps) with assistance level (OT) Description: LTG: Patient will  perform toileting task (3/3 steps) with assistance level (OT)  Outcome: Completed/Met   Problem: RH Simple Meal Prep Goal: LTG Patient will perform simple meal prep w/assist (OT) Description: LTG: Patient will perform simple meal prep with assistance, with/without cues (OT). Outcome: Completed/Met   Problem: RH Light Housekeeping Goal: LTG Patient will perform light housekeeping w/assist (OT) Description: LTG: Patient will perform light housekeeping with assistance, with/without cues (OT). Outcome: Completed/Met   Problem: RH Toilet Transfers Goal: LTG Patient will perform toilet transfers w/assist (OT) Description: LTG: Patient will perform toilet transfers with assist, with/without cues using equipment (OT) Outcome: Completed/Met

## 2021-03-31 NOTE — Progress Notes (Signed)
Occupational Therapy Discharge Summary  Patient Details  Name: Samuel Norris MRN: [id number] Date of Birth: 1936-02-02  Today's Date: 03/31/2021 OT Individual Time: 0922-1000 OT Individual Time Calculation (min): 38 min + 75 min   Patient has met 10 of 11 long term goals due to improved activity tolerance, improved balance, postural control, ability to compensate for deficits, improved attention, improved awareness, and improved coordination.  Patient to discharge at overall Modified Independent level.  Patient's care partner  not present for formal education with OT, pt reports daughter works during the day, but is home at night  to provide the necessary physical assistance at discharge.  Pt to DC at the below ADL/bathroom transfer performance level. Assist levels represent pt's most consistent performance when alert/participatory. Pt continues to be primarily limited by occasional dizziness with quick turns during functional mobility.  Pt and caregivers will benefit from continued OP OT to facilitate improved caregiver education, functional mobility, and occupational performance.    Reasons goals not met: Did not meet Tub/shower transfer goal at mod I level, requires S to CGA due to difficulty getting BLE over tub safely. Recommend practicing dry runs with daughter present prior to completing mod I.  Recommendation:  Patient will benefit from ongoing skilled OT services in outpatient setting to continue to advance functional skills in the area of BADL, iADL, and Reduce care partner burden.  Equipment: No equipment provided  Reasons for discharge: treatment goals met and discharge from hospital  Patient/family agrees with progress made and goals achieved: Yes  OT Discharge Precautions/Restrictions  Precautions Precautions: Fall Precaution Comments: occasional dizziness Restrictions Weight Bearing Restrictions: No Pain Pain Assessment Pain Scale: 0-10 Pain Score: 0-No  pain ADL ADL Eating: Independent Where Assessed-Eating: Chair Grooming: Modified independent Where Assessed-Grooming: Standing at sink, Sitting at sink Upper Body Bathing: Modified independent Where Assessed-Upper Body Bathing: Shower Lower Body Bathing: Modified independent Where Assessed-Lower Body Bathing: Shower Upper Body Dressing: Independent Where Assessed-Upper Body Dressing: Sitting at sink Lower Body Dressing: Modified independent Where Assessed-Lower Body Dressing: Chair, Bed level Toileting: Modified independent Where Assessed-Toileting: Glass blower/designer: Diplomatic Services operational officer Method: Counselling psychologist: Energy manager: Not assessed Social research officer, government: Chief Financial Officer Method: Ambulating Vision Baseline Vision/History: 0 No visual deficits Patient Visual Report: No change from baseline Vision Assessment?: No apparent visual deficits Perception  Perception: Within Functional Limits Praxis Praxis: Intact Cognition Overall Cognitive Status: Within Functional Limits for tasks assessed Arousal/Alertness: Awake/alert Orientation Level: Oriented X4 Year: 2023 Month: January Day of Week: Correct Memory: Appears intact Immediate Memory Recall: Sock;Blue;Bed Memory Recall Sock: Without Cue Memory Recall Blue: Without Cue Memory Recall Bed: Without Cue Awareness: Appears intact Problem Solving: Appears intact Safety/Judgment: Appears intact Sensation Sensation Light Touch: Appears Intact Hot/Cold: Appears Intact Proprioception: Appears Intact Stereognosis: Appears Intact Coordination Gross Motor Movements are Fluid and Coordinated: Yes Fine Motor Movements are Fluid and Coordinated: Yes Coordination and Movement Description: generalized deconditioning and reliance of unilateral UE support for balance with dynamic tasks, greatly improved from eval Finger Nose Finger Test:  mild dysmetira on L but [facility] Motor  Motor Motor: Within Functional Limits Motor - Discharge Observations: generalized deconditioning and reliance of unilateral UE support for balance with dynamic tasks, greatly improved from eval Mobility  Transfers Sit to Stand: Independent with assistive device  Trunk/Postural Assessment  Cervical Assessment Cervical Assessment: Within Functional Limits Thoracic Assessment Thoracic Assessment: Exceptions to [facility] (rounded shoulders) Lumbar Assessment Lumbar Assessment: Exceptions to [facility] (decreased rotation  and lordosis) Postural Control Postural Control: Within Functional Limits  Balance Balance Balance Assessed: Yes Static Sitting Balance Static Sitting - Balance Support: No upper extremity supported Static Sitting - Level of Assistance: 7: Independent Dynamic Sitting Balance Dynamic Sitting - Balance Support: No upper extremity supported Dynamic Sitting - Level of Assistance: 6: Modified independent (Device/Increase time) Static Standing Balance Static Standing - Balance Support: Bilateral upper extremity supported;During functional activity Static Standing - Level of Assistance: 6: Modified independent (Device/Increase time) Dynamic Standing Balance Dynamic Standing - Balance Support: Bilateral upper extremity supported;During functional activity Dynamic Standing - Level of Assistance: 6: Modified independent (Device/Increase time) Extremity/Trunk Assessment RUE Assessment RUE Assessment: Within Functional Limits LUE Assessment LUE Assessment: Within Functional Limits Session 1 ([phone]): Pt received seated in recliner with PT and interpreter present, denies pain, agreeable to therapy. Session focus on self-care retraining, activity tolerance, dynamic standing balance, transfer retraining in prep for improved ADL/IADL/func mobility performance + decreased caregiver burden. Completed ambulatory toilet transfer and toileting tasks with use of  RW/grab bars at mod I level post continent void of bladder. Declined further need for ADL. Ambulated > nurses station and able to prepare coffee with mod I. Ambulated to and from hospital atrium at same level, required seated rest break upon arrival and able to manage pulling out chair/RW. Completed DC reassessments as documented above and discussed completing safe pet care at home. Pt reports he doesn't walk his dog on a leash, he just has to let it out the door. Pt made mod I in room in discussion with PT, nursing staff notified. Educated pt to use RW and to call if he feels unwell. Verbalized understanding.  Pt left seated in recliner with call bell in reach, and all immediate needs met.    Session 2 [phone]): Pt received seated in recliner with live interpreter Nicole Kindred present, no c/o pain, agreeable to therapy. Session focus on self-care retraining, activity tolerance, transfer retraining, dynamic standing balance in prep for improved ADL/IADL/func mobility performance + decreased caregiver burden. Completed ambulatory toilet transfer x2 with use of RW and grab bars. Ambulated throughout session with S due to increased distance/fatigue + RW.   In ADL apartment, pt able to warm up can of soup, pour into bowl, wash dishes and place into dish washer with mod I (use of BUE support on counter top). Demonstrated item transport with use of RW, but pt adadment that his kitchen is small enough that he won't have to walk around much. Continued to encourage use of RW with mobility due to balance deficits. Discussed energy conservation principles as well to incorporate into daily routine.   Pt able to complete tub/shower transfer with use of shower seat and BUE support on wall with overall CGA due to pt difficulty raising BLE over side of tub. Pt with sudden cramp in thigh and had to rest on edge of tub. Encouraged "dry run" practice with daughter prior to showering at home and demonstrated use of TTB if pt  interested. Pt insists he will be able to enter/exit shower at home, if not he will sink bathe.  Targeted activity tolerance and dynamic standing balance without use of RW with the following activities:  -Pt completed various obstacle courses in maxi sky walking sling involving weaving in/out cones, stepping over 1 inch barriers, and carrying full cup of water. No LOB throughout.  -Pt played one round of corn hole with no RW. Several minor LOB requiring min A to regain balance and return safely  to mat. Reports dizziness/fatigue. Ambulated back to room same manner as before. Educated on need to call for assist if feeling unwell prior to mobility, verbalized understanding.  Pt left seated in recliner with call bell in reach, and all immediate needs met.    Volanda Napoleon MS, OTR/L  03/31/2021, 12:44 PM

## 2021-03-31 NOTE — Progress Notes (Signed)
PROGRESS NOTE   Subjective/Complaints: COnstipation , last BM ~2-3 d ago, no abd pain , no nausea Dizziness during therapy improving Asking about driving   ROS- neg CP, SOB, N/V/D, pain   Objective:   No results found. No results for input(s): WBC, HGB, HCT, PLT in the last 72 hours.  No results for input(s): NA, K, CL, CO2, GLUCOSE, BUN, CREATININE, CALCIUM in the last 72 hours.   Intake/Output Summary (Last 24 hours) at 03/31/2021 0908 Last data filed at 03/31/2021 0742 Gross per 24 hour  Intake 957 ml  Output 1725 ml  Net -768 ml         Physical Exam: Vital Signs Blood pressure 126/60, pulse (!) 54, temperature 97.6 F (36.4 C), temperature source Oral, resp. rate 20, height 5\' 7"  (1.702 m), weight 120.2 kg, SpO2 95 %.  Gen: no distress, normal appearing HEENT: oral mucosa pink and moist, NCAT Cardio: Reg rate Chest: normal effort, normal rate of breathing Abd: soft, non-distended Extremities: No clubbing, cyanosis, or edema Skin: No evidence of breakdown, no evidence of rash Neurologic: Cranial nerves II through XII intact, motor strength is 5/5 in bilateral deltoid, bicep, tricep, grip, hip flexor, knee extensors, ankle dorsiflexor and plantar flexor Sensory exam normal sensation to light touch and proprioception in bilateral upper and lower extremities Visual fields intact to confrontation testing  Cerebellar exam normal finger to nose to finger as well as heel to shin in bilateral upper and lower extremities Musculoskeletal: tenderness to palpation midline in lumbar spine  No joint swelling    Assessment/Plan: 1. Functional deficits which require 3+ hours per day of interdisciplinary therapy in a comprehensive inpatient rehab setting. Physiatrist is providing close team supervision and 24 hour management of active medical problems listed below. Physiatrist and rehab team continue to assess barriers to  discharge/monitor patient progress toward functional and medical goals  Care Tool:  Bathing    Body parts bathed by patient: Chest, Abdomen, Left arm, Right arm, Front perineal area, Right upper leg, Left upper leg, Buttocks, Face, Right lower leg, Left lower leg   Body parts bathed by helper: Right lower leg, Left lower leg     Bathing assist Assist Level: Set up assist     Upper Body Dressing/Undressing Upper body dressing   What is the patient wearing?: Pull over shirt    Upper body assist Assist Level: Independent    Lower Body Dressing/Undressing Lower body dressing      What is the patient wearing?: Pants     Lower body assist Assist for lower body dressing: Set up assist     Toileting Toileting    Toileting assist Assist for toileting: Supervision/Verbal cueing     Transfers Chair/bed transfer  Transfers assist  Chair/bed transfer activity did not occur: Safety/medical concerns  Chair/bed transfer assist level: Supervision/Verbal cueing     Locomotion Ambulation   Ambulation assist      Assist level: Minimal Assistance - Patient > 75% Assistive device: No Device Max distance: 66ft   Walk 10 feet activity   Assist     Assist level: Maximal Assistance - Patient 25 - 49%     Walk 50  feet activity   Assist Walk 50 feet with 2 turns activity did not occur: Safety/medical concerns         Walk 150 feet activity   Assist Walk 150 feet activity did not occur: Safety/medical concerns         Walk 10 feet on uneven surface  activity   Assist Walk 10 feet on uneven surfaces activity did not occur: Safety/medical concerns         Wheelchair     Assist Is the patient using a wheelchair?: Yes Type of Wheelchair: Manual    Wheelchair assist level: Minimal Assistance - Patient > 75% Max wheelchair distance: 150    Wheelchair 50 feet with 2 turns activity    Assist        Assist Level: Minimal Assistance -  Patient > 75%   Wheelchair 150 feet activity     Assist      Assist Level: Minimal Assistance - Patient > 75%   Blood pressure 126/60, pulse (!) 54, temperature 97.6 F (36.4 C), temperature source Oral, resp. rate 20, height 5\' 7"  (1.702 m), weight 120.2 kg, SpO2 95 %.  Medical Problem List and Plan: 1. 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  Antithrombotics: -DVT/anticoagulation:  Pharmaceutical: Lovenox             -antiplatelet therapy: Aspirin 325 mg daily and Plavix 75 mg daily x3 months then Plavix alone as noted by neurology services 3. Lumbar strain Muscle spasm: continue Flexeril as needed, trial lidoderm patch - no further c/os d/c lidoderm  4.Insomnia: resolved, continue Melatonin as needed.  Provide emotional support             -antipsychotic agents: N/A 5. Neuropsych: This patient is capable of making decisions on his own behalf. 6. Skin/Wound Care: Routine skin checks 7. Fluids/Electrolytes/Nutrition: Routine in and outs with follow-up chemistries 8.  Hypertension.  Continue Cozaar 100 mg daily.  Monitor with increased mobility Vitals:   03/30/21 2028 03/31/21 0607  BP:  126/60  Pulse:  (!) 54  Resp:  20  Temp:  97.6 F (36.4 C)  SpO2: 92% 95%   Early am systolic elevation but controlled during the day , will not add additional BP meds to avoid hypotension during the day  9.  Hyperlipidemia.  Lipitor 10.  Diabetes mellitus.  Hemoglobin A1c 8.4.  Tradjenta 5mg  daily.  Check blood sugars before meals and at bedtime CBG (last 3)  Recent Labs    03/30/21 1651 03/30/21 2032 03/31/21 0546  GLUCAP 173* 162* 146*    Fair control , cont current diabetic meds for d/c and f/u with PCP  11.  Hypothyroidism.  Synthroid 12.   Obesity.  BMI 41.50.  Dietary follow-up 13.  OSA.  Nonadherent to CPAP.  Quit smoking 3 years ago.  Continue inhalers. 14. Left posterior headache: start topamax 25mg  HS- improved  15. Nausea and vomiting: add prn zofran.   16.  Freq urination , no dysuria, per pt has been told by MD that his prostate was enlarged, will start flomax trial- still voiding freq, may  take several more days to take full effect   LOS: 6 days A FACE TO FACE EVALUATION WAS PERFORMED  Erick Colace 03/31/2021, 9:08 AM

## 2021-04-01 ENCOUNTER — Other Ambulatory Visit (HOSPITAL_COMMUNITY): Payer: Self-pay

## 2021-04-01 LAB — CREATININE, SERUM
Creatinine, Ser: 0.95 mg/dL (ref 0.61–1.24)
GFR, Estimated: >60 mL/min (ref >60)

## 2021-04-01 LAB — GLUCOSE, CAPILLARY: Glucose-Capillary: 117 mg/dL — ABNORMAL HIGH (ref 70–99)

## 2021-04-01 NOTE — Progress Notes (Signed)
PROGRESS NOTE   Subjective/Complaints: Last BM 1/16, still voiding freq - has seen Urology in past ~2-3 yrs ago, we discussed he should see urology again for frequent nocturia   ROS- neg CP, SOB, N/V/D, pain   Objective:   No results found. No results for input(s): WBC, HGB, HCT, PLT in the last 72 hours.  Recent Labs    04/01/21 0600  CREATININE 0.95     Intake/Output Summary (Last 24 hours) at 04/01/2021 0902 Last data filed at 04/01/2021 0700 Gross per 24 hour  Intake 700 ml  Output 250 ml  Net 450 ml         Physical Exam: Vital Signs Blood pressure 109/67, pulse 65, temperature 97.8 F (36.6 C), resp. rate 16, height 5\' 7"  (1.702 m), weight 120.2 kg, SpO2 95 %.  Gen: no distress, normal appearing HEENT: oral mucosa pink and moist, NCAT Cardio: Reg rate Chest: normal effort, normal rate of breathing Abd: soft, non-distended Extremities: No clubbing, cyanosis, or edema Skin: No evidence of breakdown, no evidence of rash Neurologic: Cranial nerves II through XII intact, motor strength is 5/5 in bilateral deltoid, bicep, tricep, grip, hip flexor, knee extensors, ankle dorsiflexor and plantar flexor Sensory exam normal sensation to light touch and proprioception in bilateral upper and lower extremities Visual fields intact to confrontation testing  Cerebellar exam normal finger to nose to finger as well as heel to shin in bilateral upper and lower extremities Musculoskeletal: tenderness to palpation midline in lumbar spine  No joint swelling    Assessment/Plan: 1. Functional deficits due to Left   cerebellar infarct Stable for D/C today F/u PCP in 3-4 weeks F/u PM&R 2 weeks See D/C summary See D/C instructions   Care Tool:  Bathing    Body parts bathed by patient: Chest, Abdomen, Left arm, Right arm, Front perineal area, Right upper leg, Left upper leg, Buttocks, Face, Right lower leg, Left lower  leg   Body parts bathed by helper: Right lower leg, Left lower leg     Bathing assist Assist Level: Independent with assistive device     Upper Body Dressing/Undressing Upper body dressing   What is the patient wearing?: Pull over shirt    Upper body assist Assist Level: Independent    Lower Body Dressing/Undressing Lower body dressing      What is the patient wearing?: Pants     Lower body assist Assist for lower body dressing: Independent with assitive device     Toileting Toileting    Toileting assist Assist for toileting: Independent with assistive device     Transfers Chair/bed transfer  Transfers assist  Chair/bed transfer activity did not occur: Safety/medical concerns  Chair/bed transfer assist level: Supervision/Verbal cueing     Locomotion Ambulation   Ambulation assist      Assist level: Minimal Assistance - Patient > 75% Assistive device: No Device Max distance: 68ft   Walk 10 feet activity   Assist     Assist level: Maximal Assistance - Patient 25 - 49%     Walk 50 feet activity   Assist Walk 50 feet with 2 turns activity did not occur: Safety/medical concerns  Walk 150 feet activity   Assist Walk 150 feet activity did not occur: Safety/medical concerns         Walk 10 feet on uneven surface  activity   Assist Walk 10 feet on uneven surfaces activity did not occur: Safety/medical concerns         Wheelchair     Assist Is the patient using a wheelchair?: Yes Type of Wheelchair: Manual    Wheelchair assist level: Minimal Assistance - Patient > 75% Max wheelchair distance: 150    Wheelchair 50 feet with 2 turns activity    Assist        Assist Level: Minimal Assistance - Patient > 75%   Wheelchair 150 feet activity     Assist      Assist Level: Minimal Assistance - Patient > 75%   Blood pressure 109/67, pulse 65, temperature 97.8 F (36.6 C), resp. rate 16, height 5\' 7"  (1.702  m), weight 120.2 kg, SpO2 95 %.  Medical Problem List and Plan: 1. 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  Antithrombotics: -DVT/anticoagulation:  Pharmaceutical: Lovenox             -antiplatelet therapy: Aspirin 325 mg daily and Plavix 75 mg daily x3 months then Plavix alone as noted by neurology services 3. Lumbar strain Muscle spasm: continue Flexeril as needed, trial lidoderm patch - no further c/os d/c lidoderm  4.Insomnia: resolved, continue Melatonin as needed.  Provide emotional support             -antipsychotic agents: N/A 5. Neuropsych: This patient is capable of making decisions on his own behalf. 6. Skin/Wound Care: Routine skin checks 7. Fluids/Electrolytes/Nutrition: Routine in and outs with follow-up chemistries 8.  Hypertension.  Continue Cozaar 100 mg daily.  Monitor with increased mobility Vitals:   03/31/21 2006 04/01/21 0449  BP: (!) 133/52 109/67  Pulse: 73 65  Resp:  16  Temp: 97.9 F (36.6 C) 97.8 F (36.6 C)  SpO2: 97% 95%   Early am systolic elevation but controlled during the day , will not add additional BP meds to avoid hypotension during the day  9.  Hyperlipidemia.  Lipitor 10.  Diabetes mellitus.  Hemoglobin A1c 8.4.  Tradjenta 5mg  daily.  Check blood sugars before meals and at bedtime CBG (last 3)  Recent Labs    03/31/21 1649 03/31/21 2047 04/01/21 0544  GLUCAP 194* 215* 117*    Fair control , cont current diabetic meds for d/c and f/u with PCP  11.  Hypothyroidism.  Synthroid 12.  Obesity.  BMI 41.50.  Dietary follow-up 13.  OSA.  Nonadherent to CPAP.  Quit smoking 3 years ago.  Continue inhalers. 14. Left posterior headache: start topamax 25mg  HS- improved  15. Nausea and vomiting: add prn zofran.    16.  Freq urination , no dysuria, per pt has been told by MD that his prostate was enlarged, will start flomax trial- still voiding freq, may take several more days to take full effect   LOS: 7 days A FACE TO FACE EVALUATION WAS PERFORMED  2048 04/01/2021, 9:02  AM

## 2021-04-01 NOTE — Progress Notes (Signed)
Inpatient Rehabilitation Care Coordinator Discharge Note   Patient Details  Name: Samuel Norris MRN: [id number] Date of Birth: 1935/09/22   Discharge location: Home  Length of Stay: 7 Days  Discharge activity level: Sup  Home/community participation: Spouse and daughter  Patient response YQ:MVHQIO Literacy - How often do you need to have someone help you when you read instructions, pamphlets, or other written material from your doctor or pharmacy?: Sometimes  Patient response NG:EXBMWU Isolation - How often do you feel lonely or isolated from those around you?: Never  Services provided included: SW, Pharmacy, TR, SLP, RN, CM, OT, PT, RD, MD  Financial Services:  Financial Services Utilized: Private Insurance [facility]  Choices offered to/list presented to: pt  Follow-up services arranged:  Home Health Home Health Agency: TBD         Patient response to transportation need: Is the patient able to respond to transportation needs?: Yes In the past 12 months, has lack of transportation kept you from medical appointments or from getting medications?: No In the past 12 months, has lack of transportation kept you from meetings, work, or from getting things needed for daily living?: No    Comments (or additional information):  Patient/Family verbalized understanding of follow-up arrangements:  Yes  Individual responsible for coordination of the follow-up plan: Yolanda  Confirmed correct DME delivered: Andria Rhein 04/01/2021    Andria Rhein

## 2021-04-01 NOTE — Progress Notes (Signed)
Patient discharged to home accompanied by his wife. 

## 2021-04-02 NOTE — Plan of Care (Signed)
Problem: RH Balance Goal: LTG Patient will maintain dynamic sitting balance (PT) Description: LTG:  Patient will maintain dynamic sitting balance with assistance during mobility activities (PT) Outcome: Completed/Met Flowsheets (Taken 03/31/2021 1839) LTG: Pt will maintain dynamic sitting balance during mobility activities with:: Independent Goal: LTG Patient will maintain dynamic standing balance (PT) Description: LTG:  Patient will maintain dynamic standing balance with assistance during mobility activities (PT) Outcome: Completed/Met Flowsheets (Taken 03/31/2021 1839) LTG: Pt will maintain dynamic standing balance during mobility activities with:: Independent with assistive device    Problem: RH Bed Mobility Goal: LTG Patient will perform bed mobility with assist (PT) Description: LTG: Patient will perform bed mobility with assistance, with/without cues (PT). Outcome: Completed/Met Flowsheets (Taken 03/31/2021 1839) LTG: Pt will perform bed mobility with assistance level of: Independent   Problem: RH Bed to Chair Transfers Goal: LTG Patient will perform bed/chair transfers w/assist (PT) Description: LTG: Patient will perform bed to chair transfers with assistance (PT). Outcome: Completed/Met Flowsheets (Taken 03/31/2021 1839) LTG: Pt will perform Bed to Chair Transfers with assistance level: Independent with assistive device    Problem: RH Car Transfers Goal: LTG Patient will perform car transfers with assist (PT) Description: LTG: Patient will perform car transfers with assistance (PT). Outcome: Completed/Met Flowsheets (Taken 03/26/2021 1006 by Lorie Phenix, PT) LTG: Pt will perform car transfers with assist:: Supervision/Verbal cueing   Problem: RH Ambulation Goal: LTG Patient will ambulate in controlled environment (PT) Description: LTG: Patient will ambulate in a controlled environment, # of feet with assistance (PT). Outcome: Completed/Met Flowsheets Taken 03/31/2021  1839 by Alger Simons, PT LTG: Ambulation distance in controlled environment: 262ft with RW Taken 03/26/2021 1006 by Lorie Phenix, PT LTG: Pt will ambulate in controlled environ  assist needed:: Independent with assistive device Goal: LTG Patient will ambulate in home environment (PT) Description: LTG: Patient will ambulate in home environment, # of feet with assistance (PT). Outcome: Completed/Met Flowsheets Taken 03/31/2021 1839 by Alger Simons, PT LTG: Pt will ambulate in home environ  assist needed:: Independent with assistive device Taken 03/26/2021 1006 by Lorie Phenix, PT LTG: Ambulation distance in home environment: 60ft with LRAD   Problem: RH Wheelchair Mobility Goal: LTG Patient will propel w/c in controlled environment (PT) Description: LTG: Patient will propel wheelchair in controlled environment, # of feet with assist (PT) Outcome: Completed/Met Flowsheets (Taken 03/26/2021 1006 by Lorie Phenix, PT) LTG: Pt will propel w/c in controlled environ  assist needed:: Supervision/Verbal cueing   Problem: RH Stairs Goal: LTG Patient will ambulate up and down stairs w/assist (PT) Description: LTG: Patient will ambulate up and down # of stairs with assistance (PT) Outcome: Completed/Met Flowsheets (Taken 03/26/2021 1006 by Lorie Phenix, PT) LTG: Pt will ambulate up/down stairs assist needed:: Supervision/Verbal cueing LTG: Pt will  ambulate up and down number of stairs: 16 steps to access bed/bath in home set up

## 2021-04-02 NOTE — Progress Notes (Signed)
Physical Therapy Discharge Summary  Patient Details  Name: Samuel Norris MRN: [id number] Date of Birth: Jun 15, 1935  Today's Date: 03/31/2021  Patient has met 10 of 10 long term goals due to improved activity tolerance, improved balance, improved postural control, functional use of  right upper extremity and right lower extremity, improved awareness, and improved coordination.  Patient to discharge at an ambulatory level Modified Independent.   Patient's care partner is independent to provide the necessary physical and cognitive assistance at discharge.  Reasons goals not met: n/a  Recommendation:  Patient will benefit from ongoing skilled PT services in outpatient setting to continue to advance safe functional mobility, address ongoing impairments in strength, coordination, balance, activity tolerance, cognition, safety awareness, and to minimize fall risk.  Equipment: Conservation officer, nature  Reasons for discharge: treatment goals met and discharge from hospital  Patient/family agrees with progress made and goals achieved: Yes  PT Discharge Precautions/Restrictions  Precautions Precautions: Fall Precaution Comments: occasional dizziness Restrictions Weight Bearing Restrictions: No Pain Pain Assessment Pain Scale: 0-10 Pain Score: 0-No pain Pain Interference Pain Interference Pain Effect on Sleep: 0. Does not apply - I have not had any pain or hurting in the past 5 days Pain Interference with Therapy Activities: 1. Rarely or not at all Pain Interference with Day-to-Day Activities: 1. Rarely or not at all Vision/Perception  Vision - History Ability to See in Adequate Light: 0 Adequate Perception Perception: Within Functional Limits Praxis Praxis: Intact  Cognition Overall Cognitive Status: WFL for tasks assessed Orientation Level: Oriented X4 Memory: Appears Intact Awareness: Appears Intact Problem Solving: Appears Intact Safety/ judgment: Appears  intact Sensation Sensation Light Touch: Appears Intact Proprioception: Appears Intact Coordination Gross Motor Movements are Fluid and Coordinated: Yes Fine Motor Movements are Fluid and Coordinated: Yes Coordination and Movement Description: generalized deconditioning and reliance of unilateral UE support for balance with dynamic tasks, greatly improved from eval Finger Nose Finger Test: mild dysmetira on L but WFL Heel Shin Test: limited hip flexion due to body habitus, grossly WFL Motor  Motor Motor: [facility] Motor Discharge Observations: generalized deconditioning and reliance of at least unilateral UE support for balance with dynamic tasks, greatly improved from eval Mobility Bed Mobility Bed Mobility: Rolling Right;Rolling Left;Supine to Sit;Sit to Supine Rolling Right: Independent with assistive device Rolling Left: Independent with assistive device Supine to Sit: Independent Sit to Supine: Independent Transfers Transfers: Sit to [employer] Transfers Sit to Stand: Independent with assistive device; Independent Stand Pivot Transfers: Independent with assistive device Transfer (Assistive device): Rolling walker Locomotion  Gait Ambulation: Yes Gait Assistance: Independent with assistive device Gait Distance (Feet): 350 Feet Assistive device: Rolling walker Gait Assistance Details: gait without AD significantly improved from eval, but continues to require supervision Gait Gait: Yes Gait Pattern: Impaired Gait Pattern: Wide base of support Stairs / Additional Locomotion Stairs: Yes Stairs Assistance: Supervision/Verbal cueing (unable to perform without UE support on rails/ walls) Stair Management Technique: Two rails Number of Stairs: 16 Height of Stairs: 6  Trunk/Postural Assessment  Cervical Assessment Cervical Assessment: Within Functional Limits Thoracic Assessment Thoracic Assessment: Exceptions to [facility] (rounded shoulders) Lumbar Assessment Lumbar  Assessment: Exceptions to [facility] (decreased rotation and lordosis) Postural Control Postural Control: Within Functional Limits  Balance Berg Balance: Sit to Stand -- Able to stand without using hands and stabilize independently -- Able to stand without using hands and stabilize independently  Standing Unsupported -- Able to stand 2 minutes with supervision -- Able to stand safely 2 minutes  Sitting with Back Unsupported  but Feet Supported on Floor or Stool -- Able to sit safely and securely 2 minutes -- Able to sit safely and securely 2 minutes  Stand to Sit -- Controls descent by using hands -- Sits safely with minimal use of hands  Transfers -- Able to transfer safely, definite need of hands -- Able to transfer safely, definite need of hands  Standing Unsupported with Eyes Closed -- Able to stand 3 seconds -- Able to stand 10 seconds with supervision  Standing Ubsupported with Feet Together -- Needs help to attain position but able to stand for 30 seconds with feet together -- Able to place feet together independently but unable to hold for 30 seconds  From Standing, Reach Forward with Outstretched Arm -- Can reach forward >12 cm safely (5") -- Can reach forward >12 cm safely (5")  From Standing Position, Pick up Object from Floor -- Unable to pick up and needs supervision -- Able to pick up shoe, needs supervision  From Standing Position, Turn to Look Behind Over each Shoulder -- Needs supervision when turning -- Looks behind from both sides and weight shifts well  Turn 360 Degrees -- Needs assistance while turning -- Able to turn 360 degrees safely but slowly  Standing Unsupported, Alternately Place Feet on Step/Stool -- Able to complete >2 steps/needs minimal assist -- Able to complete >2 steps/needs minimal assist  Standing Unsupported, One Foot in Front -- Needs help to step but can hold 15 seconds -- Able to plae foot ahead of the other independently and hold 30 seconds  Standing on One Leg --  Unable to try or needs assist to prevent fall -- Unable to try or needs assist to prevent fall  Total Score -- 27 -- 40   TUG: Normal TUG Normal TUG (seconds): 23 (scored 23.5sec without RW; 21sec using RW)  Balance Assessed: Yes Static Sitting Balance Static Sitting - Balance Support: No upper extremity supported Static Sitting - Level of Assistance: 7: Independent Dynamic Sitting Balance Dynamic Sitting - Balance Support: No upper extremity supported Dynamic Sitting - Level of Assistance: 6: Modified independent (Device/Increase time) Static Standing - Balance Support: Bilateral upper extremity supported; During functional activity  Static Standing - Level of Assistance: 6: Modified independent (Device/Increase time) Dynamic Standing Balance Dynamic Standing - Balance Support: Bilateral upper extremity supported; During functional activity Dynamic Standing - Level of Assistance: 6: Modified independent (Device/Increase time) Extremity Assessment  RLE Assessment RLE Assessment: Exceptions to [facility] General Strength Comments: grossly 4+/5 to 5/5 LLE Assessment LLE Assessment: Within Functional Limits General Strength Comments: grossly 4+/5 to 5/5    Alger Simons 03/31/2021, 6:34 PM

## 2021-04-03 ENCOUNTER — Telehealth: Payer: Self-pay

## 2021-04-03 NOTE — Telephone Encounter (Signed)
Transition Care Management Unsuccessful Follow-up Telephone Call  Date of discharge and from where: [facility] on 03/22/2021  Attempts:  1st Attempt  Reason for unsuccessful TCM follow-up call:  Left voice message  Message left for Patsy Lager (daughter) to return call.

## 2021-04-06 ENCOUNTER — Other Ambulatory Visit (HOSPITAL_BASED_OUTPATIENT_CLINIC_OR_DEPARTMENT_OTHER): Payer: Self-pay

## 2021-04-14 ENCOUNTER — Other Ambulatory Visit: Payer: Self-pay

## 2021-04-14 ENCOUNTER — Encounter: Payer: Medicare Other | Admitting: Registered Nurse

## 2021-04-14 ENCOUNTER — Encounter: Payer: Medicare Other | Attending: Registered Nurse | Admitting: Registered Nurse

## 2021-04-14 ENCOUNTER — Encounter: Payer: Self-pay | Admitting: Registered Nurse

## 2021-04-14 VITALS — BP 144/79 | HR 69 | Temp 98.5°F | Ht 67.0 in | Wt 264.0 lb

## 2021-04-14 DIAGNOSIS — I10 Essential (primary) hypertension: Secondary | ICD-10-CM | POA: Insufficient documentation

## 2021-04-14 DIAGNOSIS — I63542 Cerebral infarction due to unspecified occlusion or stenosis of left cerebellar artery: Secondary | ICD-10-CM | POA: Insufficient documentation

## 2021-04-14 NOTE — Patient Instructions (Addendum)
Down Load My Chart APP:   My-Chart Number : [id number] [phone]   Christina: Social Worker: [phone]

## 2021-04-14 NOTE — Progress Notes (Signed)
Subjective:    Patient ID: Samuel Norris, male    DOB: 1935-10-01, 86 y.o.   MRN: [id number]  HPI: Samuel AbrahamsFernando Dearman is a 86 y.o. 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 [facility]/2021 with complaints of headache, nausea/vomiting and unsteady gait.  HPI: Dr Jeraldine LootsLockwood on 03/21/2021 HPI Presents with 1 day of unsteadiness, headache, nausea and vomiting.  Onset was 8 AM, approximately 10 hours ago.  Went to bed feeling well, no recent changes in medicine.  He does smoke, but is otherwise generally awake, alert, functional.  Today, symptoms been persistent, with a focal pain in the left posterior head.  No ability to take medication, with post intake vomiting.  No confusion, no focal weakness, though he feels unsteady on his feet.  CT Head: WO Contrast:  IMPRESSION: 1. Focal area of low-density and loss of gray-white differentiation in the right occipital lobe, suspicious for infarct, likely subacute. Consider further evaluation with MRI. 2. Age related atrophy and chronic small vessel ischemia.  MR Brain: MR Angio: IMPRESSION: 1. Small acute infarct of the inferior medial left cerebellar hemisphere. No hemorrhage or mass effect. 2. Diminished flow related enhancement of the left vertebral artery V4 segment, consistent with high-grade stenosis. 3. Otherwise normal intracranial MRA.  CT Angio: IMPRESSION: 1. Positive for occluded Left Vertebral Artery just beyond its origin. Only partial reconstituted V4 segment but the left PICA origin appears to remain patent.   2. Mild to moderate right PCA P2 segment stenosis. But no other significant arterial stenosis in the head or neck. Generalized arterial tortuosity. Retrograde course of the carotids.   3. Expected CT appearance of the Left cerebellar infarct. No associated hemorrhage or  mass effect.   4. No new intracranial abnormality.   Neurology was consulted: He was maintained on aspirin and Plavix daily for three months than Plavix alone.   Mr. Sherlon HandingRodriguez was admitted to inpatient Rehabilitation on 03/25/2021 and discharged home on 04/01/2021. An e-mail was sent to Lavera GuiseChristina [place] to inquire on [facility], she was asked to call Mr. Sherlon HandingRodriguez daughter using the LAS line.   He denies any pain. He rates his pain 0. Also reports his appetite is fair.   Spoke with Mr. Sherlon HandingRodriguez and daughter via LAS Tablet, he reports on three occassions he developed a headache with lound sound. At this time he denies any headache.   He arrived in wheelchair.  Pain Inventory Average Pain 0 Pain Right Now 0 My pain is  no pain   LOCATION OF PAIN  no pain  BOWEL Number of stools per week: 2-3 x Oral laxative use No  Type of laxative n/a Enema or suppository use No  History of colostomy No  Incontinent No   BLADDER Normal In and out cath, frequency n/a Able to self cath No  Bladder incontinence No  Frequent urination No  Leakage with coughing No  Difficulty starting stream No  Incomplete bladder emptying No    Mobility walk with assistance use a walker how many minutes can you walk? 15 ability to climb steps?  yes use a wheelchair  Function retired I need assistance with the following:  dressing, bathing, meal prep, and household duties  Neuro/Psych weakness trouble walking dizziness confusion anxiety loss of taste or smell  Prior Studies Any changes since last visit?  no  Physicians involved in your care  N/a   Family History  Problem Relation Age of Onset   Stroke Father    Gout Other    Social History   Socioeconomic History   Marital status: Divorced    Spouse name: Not on file   Number of children: Not on file   Years of education: Not on file   Highest education level: Not on file  Occupational History   Not on file  Tobacco Use    Smoking status: Every Day    Types: Cigarettes   Smokeless tobacco: Not on file  Substance and Sexual Activity   Alcohol use: Never   Drug use: Never   Sexual activity: Not on file  Other Topics Concern   Not on file  Social History Narrative   Not on file   Social Determinants of Health   Financial Resource Strain: Not on file  Food Insecurity: Not on file  Transportation Needs: Not on file  Physical Activity: Not on file  Stress: Not on file  Social Connections: Not on file   Past Surgical History:  Procedure Laterality Date   LEFT HEART CATH AND CORONARY ANGIOGRAPHY  03/2019   Mild Non-obstructive CAD   Past Medical History:  Diagnosis Date   Bilateral iliac artery aneurysm (HCC)    Diabetes mellitus without complication (HCC)    HTN (hypertension)    Hyperlipidemia    Hypothyroid    Obesity    OSA (obstructive sleep apnea)    Peripheral edema    BP (!) 144/79    Pulse 69    Temp 98.5 F (36.9 C) (Oral)    Ht 5\' 7"  (1.702 m)    Wt 264 lb (119.7 kg)    SpO2 92%    BMI 41.35 kg/m   Opioid Risk Score:   Fall Risk Score:  `1  Depression screen PHQ 2/9  No flowsheet data found.     Review of Systems  Constitutional: Negative.   HENT: Negative.    Eyes: Negative.   Respiratory:  Positive for apnea, cough, shortness of breath and wheezing.   Cardiovascular: Negative.   Gastrointestinal:  Positive for constipation.  Endocrine: Negative.   Genitourinary: Negative.   Musculoskeletal:  Positive for gait problem.  Skin: Negative.   Neurological:  Positive for dizziness and weakness.  Hematological: Negative.   Psychiatric/Behavioral:  Positive for dysphoric mood and sleep disturbance. The patient is nervous/anxious.       Objective:   Physical Exam Vitals and nursing note reviewed.  Constitutional:      Appearance: Normal appearance.  Cardiovascular:     Rate and Rhythm: Normal rate and regular rhythm.     Pulses: Normal pulses.     Heart sounds:  Normal heart sounds.  Pulmonary:     Effort: Pulmonary effort is normal.     Breath sounds: Normal breath sounds.  Musculoskeletal:     Cervical back: Normal range of motion and neck supple.     Comments: Normal Muscle Bulk and Muscle Testing Reveals:  Upper Extremities: Full ROM and Muscle Strength 4/5 Lower Extremities: Full ROM and Muscle Strength 5/5 He arrived in wheelchair We walked with walker: Gait Steady    Skin:    General: Skin is warm and dry.  Neurological:     Mental Status: He is alert and oriented to person, place, and time.  Psychiatric:        Mood and Affect: Mood normal.        Behavior: Behavior normal.  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 He has a scheduled appointment with Neurology. Sent an email to Lavera Guise to inquire on his Home Health Therapy. Mr. Rada Hay verbalizes understanding.  2.Essential Hypertension: Continue current medication regimen. PCP Following. Continue to Monitor.   F/U with Dr Wynn Banker in 4-6 weeks.

## 2021-04-15 NOTE — Progress Notes (Signed)
Patient ID: Samuel Norris, male   DOB: 12/15/1935, 86 y.o.   MRN: [id number]  Patient declined by [facility]  Referral sent to [facility]

## 2021-04-28 ENCOUNTER — Other Ambulatory Visit (HOSPITAL_COMMUNITY): Payer: Self-pay

## 2021-05-15 ENCOUNTER — Encounter: Payer: Self-pay | Admitting: Physical Medicine & Rehabilitation

## 2021-05-15 ENCOUNTER — Other Ambulatory Visit: Payer: Self-pay

## 2021-05-15 ENCOUNTER — Encounter: Payer: Medicare Other | Attending: Registered Nurse | Admitting: Physical Medicine & Rehabilitation

## 2021-05-15 VITALS — BP 132/76 | HR 80 | Temp 98.3°F | Wt 259.8 lb

## 2021-05-15 DIAGNOSIS — I63542 Cerebral infarction due to unspecified occlusion or stenosis of left cerebellar artery: Secondary | ICD-10-CM | POA: Diagnosis not present

## 2021-05-15 MED ORDER — CYCLOBENZAPRINE HCL 10 MG PO TABS: 10.0000 mg | ORAL_TABLET | Freq: Three times a day (TID) | ORAL | 0 refills | Status: AC | PRN

## 2021-05-15 NOTE — Patient Instructions (Signed)
Colace or docusate sodium 1-2 tablets per day may buy without prescription in drug store  ?

## 2021-05-15 NOTE — Progress Notes (Signed)
? ?Subjective:  ? ? Patient ID: Samuel Norris, male    DOB: August 14, 1935, 86 y.o.   MRN: [id number] ?  ?HPI37 y.o. right-handed male with history of hypertension hyperlipidemia diabetes mellitus, morbid obesity, OSA nonadherent to CPAP and quit smoking 3 years ago.  Per chart review lives with daughter.  Two-level home 2 steps to entry.  Presented 03/21/2021 with headache nausea vomiting as well as unsteady gait.  Cranial CT scan showed focal area of low density in loss of gray-white differentiation in the right occipital lobe suspicious for infarction likely subacute.  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  No hemorrhage or mass-effect.  Diminished flow related enhancement of the left vertebral artery V4 segment consistent with high-grade stenosis otherwise normal intracranial MRA.  Admission chemistries unremarkable except glucose 254 AST 46 ALT 63 troponin 21 hemoglobin A1c 8.4.  Echocardiogram with ejection fraction of 60 to 65% no wall motion abnormalities grade 1 diastolic dysfunction.  Maintained on aspirin 325 mg daily and Plavix 75 mg day x3 months then Plavix alone.  Subcutaneous Lovenox for DVT prophylaxis.  Therapy evaluations completed due to patient decreased functional mobility was admitted for a comprehensive rehab program.  ?Ambulates to the toilet with supervision assist for safety rolling walker. Ambulates up to 150 feet. Stair management contact-guard. Car transfer training rolling walker supervision. Patient can gather belongings for ADLs. Completed full body bathing all sit to stand level with supervision  ?Admit date: 03/25/2021 ?Discharge date: 04/01/2021 ? ?86 year old Hispanic male with limited English here for follow-up after stroke.  He has persistent numbness on the left side.  He complains of reduced vision on the left I only.  He notes this mainly when he shuts his right eye.  He denies that this was an issue prior to the stroke.  He also notes  that he has been more constipated lately and this has been only since the stroke.  He has hard bowel movements which are painful and he only goes about every 3 days. ?He has followed up with his primary physician.  He does have a neurology appointment as well. ?Pain Inventory ?Average Pain 4 ?Pain Right Now 0 ?My pain is  no specified ? ?In the last 24 hours, has pain interfered with the following? ?General activity 0 ?Relation with others 0 ?Enjoyment of life 0 ?What TIME of day is your pain at its worst? daytime ?Sleep (in general) NA ? ?Pain is worse with: unsure ?Pain improves with: medication ?Relief from Meds: 10 ? ?Family History  ?Problem Relation Age of Onset  ? Stroke Father   ? Gout Other   ? ?Social History  ? ?Socioeconomic History  ? Marital status: Divorced  ?  Spouse name: Not on file  ? Number of children: Not on file  ? Years of education: Not on file  ? Highest education level: Not on file  ?Occupational History  ? Not on file  ?Tobacco Use  ? Smoking status: Every Day  ?  Types: Cigarettes  ? Smokeless tobacco: Not on file  ?Substance and Sexual Activity  ? Alcohol use: Never  ? Drug use: Never  ? Sexual activity: Not on file  ?Other Topics Concern  ? Not on file  ?Social History Narrative  ? Not on file  ? ?Social Determinants of Health  ? ?Financial Resource Strain: Not on file  ?Food Insecurity: Not on file  ?Transportation Needs: Not on file  ?Physical Activity: Not on file  ?Stress: Not  on file  ?Social Connections: Not on file  ? ?Past Surgical History:  ?Procedure Laterality Date  ? LEFT HEART CATH AND CORONARY ANGIOGRAPHY  03/2019  ? Mild Non-obstructive CAD  ? ?Past Surgical History:  ?Procedure Laterality Date  ? LEFT HEART CATH AND CORONARY ANGIOGRAPHY  03/2019  ? Mild Non-obstructive CAD  ? ?Past Medical History:  ?Diagnosis Date  ? Bilateral iliac artery aneurysm (HCC)   ? Diabetes mellitus without complication (HCC)   ? HTN (hypertension)   ? Hyperlipidemia   ? Hypothyroid   ?  Obesity   ? OSA (obstructive sleep apnea)   ? Peripheral edema   ? ?BP 132/76   Pulse 80   Temp 98.3 ?F (36.8 ?C)   Wt 259 lb 12.8 oz (117.8 kg)   SpO2 93%   BMI 40.69 kg/m?  ? ?Opioid Risk Score:   ?Fall Risk Score:  `1 ? ?Depression screen PHQ 2/9 ? ?Depression screen [facility] 2/9 05/15/2021 04/14/2021  ?Decreased Interest 3 3  ?Down, Depressed, Hopeless 3 3  ?PHQ - 2 Score 6 6  ?Altered sleeping - 2  ?Tired, decreased energy - 2  ?Change in appetite - 2  ?Feeling bad or failure about yourself  - 0  ?Trouble concentrating - 0  ?Moving slowly or fidgety/restless - 0  ?Suicidal thoughts - 0  ?PHQ-9 Score - 12  ?  ? ? ?Review of Systems  ?Constitutional: Negative.   ?HENT: Negative.    ?Eyes: Negative.   ?Respiratory: Negative.    ?Cardiovascular: Negative.   ?Gastrointestinal: Negative.   ?Endocrine: Negative.   ?Genitourinary: Negative.   ?Musculoskeletal:  Positive for gait problem.  ?Skin: Negative.   ?Allergic/Immunologic: Negative.   ?Hematological:  Bruises/bleeds easily.  ?     Plavix  ?Psychiatric/Behavioral:  Positive for dysphoric mood.   ?     Has episodes of wanting to cry  ?All other systems reviewed and are negative. ? ?   ?Objective:  ? Physical Exam ?Vitals and nursing note reviewed.  ?Constitutional:   ?   Appearance: He is obese.  ?HENT:  ?   Head: Normocephalic and atraumatic.  ?Eyes:  ?   Extraocular Movements: Extraocular movements intact.  ?   Conjunctiva/sclera: Conjunctivae normal.  ?   Pupils: Pupils are equal, round, and reactive to light.  ?   Comments: No evidence of nystagmus, no tracking issue  ?Skin: ?   General: Skin is warm and dry.  ?Neurological:  ?   Mental Status: He is alert.  ?   Cranial Nerves: No dysarthria or facial asymmetry.  ?   Motor: No weakness.  ?   Coordination: Coordination abnormal. Finger-Nose-Finger Test abnormal.  ?   Gait: Gait abnormal.  ?   Comments: Reduced left finger-nose-finger ?Wide-based support with standing ?Sensation reduced pinprick left hemifacial as  well as left upper extremity.  ?Psychiatric:     ?   Mood and Affect: Mood normal.     ?   Behavior: Behavior normal.  ?Wide base support no evidence of toe drag with gait ? ? ? ? ?   ?Assessment & Plan:  ?1.  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  [facility] [place] [facility] ?He will follow-up with neurology, continue Plavix and aspirin ?2.  Visual acuity loss left eye  do not think this is stroke related he has an appointment with ophthalmology later this month ?3.  Constipation likely due to reduced activity level I have recommended docusate sodium over-the-counter once or twice a day ?Physical medicine rehab follow-up on as-needed basis patient is functionally independent for basic ADLs and mobility using walker. ? ?

## 2021-05-27 NOTE — Progress Notes (Addendum)
? ? ?PATIENT: Samuel Norris ?DOB: 1935/04/16 ? ?REASON FOR VISIT: follow up ?HISTORY FROM: patient ?PRIMARY NEUROLOGIST: Dr. Pearlean Brownie ? ?HISTORY OF PRESENT ILLNESS: ?Today 05/27/21: ? ?HISTORY form Dr. Roda Shutters notes: Samuel Norris is an 86 year old male who presented to the hospital on with history of diabetes, hypertension, hyperlipidemia, smoker, morbid obesity admitted for headache, nausea vomiting and unsteady gait.  CT head showed right occipital chronic infarct.  MRI showed acute left cerebellum PICA infarct with chronic right MCA/PCA infarct.  MRA showed left V4 high-grade stenosis.  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 ? ?LDL 72 continued on Lipitor 80 mg. Discharged from hospital on ASA 325 mg and plavix 75 mg for 3 months.  ? ?Echocardiogram: EF 60-65 % ?Diabetic HGBA1C 8.4% on tradjenta & Trulicity  ? ?Untreated OSA- noncompliant with CPAP  ? ? ?Today,  ? ?Patient returns today with his wife. Since being discharged from the hospital, he feels that he is back at his baseline. No residual deficits. Currently on Plavix and Aspirin. Plans to stop ASA in April.  ? ?OSA: Unsure who did the sleep test. Not using d/t mask. Has not followed up. Sleep test in 2017 would like to get restarted on CPAP ? ?Continue to smoke- reports that he stopped one week prior to stroke but then had a stroke. Concerned that this caused his stroke ? ?Has already followed with PCP who is managing his BP, cholesterol and diabetes. ? ?Patients notes some visual loss in the left periphery. ? ? ? ?REVIEW OF SYSTEMS: Out of a complete 14 system review of symptoms, the patient complains only of the following symptoms, and all other reviewed systems are negative. ? ?ALLERGIES: ?No Known Allergies ? ?HOME MEDICATIONS: ?Outpatient Medications Prior to Visit  ?Medication Sig Dispense Refill  ? acetaminophen (TYLENOL) 325 MG tablet Take 2 tablets (650 mg total) by mouth every 4 (four) hours as needed for  mild pain (or temp > 37.5 C (99.5 F)).    ? aspirin 325 MG EC tablet take 1 tablet daily and Continue until 06/24/2021 and stop 30 tablet 0  ? atorvastatin (LIPITOR) 80 MG tablet Take 1 tablet (80 mg total) by mouth at bedtime. 30 tablet 0  ? budesonide-formoterol (SYMBICORT) 80-4.5 MCG/ACT inhaler Inhale 2 puffs into the lungs 2 (two) times daily. 10.2 g 12  ? clopidogrel (PLAVIX) 75 MG tablet Take 1 tablet (75 mg total) by mouth daily. 90 tablet 0  ? cyclobenzaprine (FLEXERIL) 10 MG tablet Take 1 tablet (10 mg total) by mouth every 8 (eight) hours as needed for muscle spasms. 30 tablet 0  ? fluorometholone (FML) 0.1 % ophthalmic suspension Place 1 drop into both eyes daily.    ? fluticasone (FLONASE) 50 MCG/ACT nasal spray Place 1 spray into both nostrils daily as needed for allergies.    ? levothyroxine (SYNTHROID) 88 MCG tablet Take 1 tablet (88 mcg total) by mouth daily. 30 tablet 0  ? linagliptin (TRADJENTA) 5 MG TABS tablet Take 1 tablet (5 mg total) by mouth daily. 30 tablet 0  ? loratadine (CLARITIN) 10 MG tablet Take 1 tablet (10 mg total) by mouth daily as needed for allergies. 30 tablet 0  ? losartan (COZAAR) 100 MG tablet Take 1 tablet (100 mg total) by mouth daily. 30 tablet 0  ? omeprazole (PRILOSEC) 40 MG capsule Take 1 capsule (40 mg total) by mouth daily. 30 capsule 0  ? tamsulosin (FLOMAX) 0.4 MG CAPS capsule  Take 1 capsule (0.4 mg total) by mouth daily after supper. 30 capsule 0  ? topiramate (TOPAMAX) 25 MG tablet Take 1 tablet (25 mg total) by mouth at bedtime. 30 tablet 0  ? ?No facility-administered medications prior to visit.  ? ? ?PAST MEDICAL HISTORY: ?Past Medical History:  ?Diagnosis Date  ? Bilateral iliac artery aneurysm (HCC)   ? Diabetes mellitus without complication (HCC)   ? HTN (hypertension)   ? Hyperlipidemia   ? Hypothyroid   ? Obesity   ? OSA (obstructive sleep apnea)   ? Peripheral edema   ? ? ?PAST SURGICAL HISTORY: ?Past Surgical History:  ?Procedure Laterality Date  ? LEFT  HEART CATH AND CORONARY ANGIOGRAPHY  03/2019  ? Mild Non-obstructive CAD  ? ? ?FAMILY HISTORY: ?Family History  ?Problem Relation Age of Onset  ? Stroke Father   ? Gout Other   ? ? ?SOCIAL HISTORY: ?Social History  ? ?Socioeconomic History  ? Marital status: Divorced  ?  Spouse name: Not on file  ? Number of children: Not on file  ? Years of education: Not on file  ? Highest education level: Not on file  ?Occupational History  ? Not on file  ?Tobacco Use  ? Smoking status: Every Day  ?  Types: Cigarettes  ? Smokeless tobacco: Not on file  ?Substance and Sexual Activity  ? Alcohol use: Never  ? Drug use: Never  ? Sexual activity: Not on file  ?Other Topics Concern  ? Not on file  ?Social History Narrative  ? Not on file  ? ?Social Determinants of Health  ? ?Financial Resource Strain: Not on file  ?Food Insecurity: Not on file  ?Transportation Needs: Not on file  ?Physical Activity: Not on file  ?Stress: Not on file  ?Social Connections: Not on file  ?Intimate Partner Violence: Not on file  ? ? ? ? ?PHYSICAL EXAM ? ?Vitals:  ? 05/28/21 0958  ?BP: 137/67  ?Pulse: 63  ?Weight: 261 lb 6.4 oz (118.6 kg)  ?Height: 5\' 8"  (1.727 m)  ? ?Body mass index is 39.75 kg/m?. ? ?Generalized: Well developed, in no acute distress  ? ?Neurological examination  ?Mentation: Alert oriented to time, place, history taking. Follows all commands speech and language fluent ?Cranial nerve II-XII: Pupils were equal round reactive to light. Extraocular movements were full, visual field were full on confrontational test. Visual disturbance in the left periphery per patient.  Facial sensation and strength were normal. Uvula tongue midline. Head turning and shoulder shrug  were normal and symmetric. ?Motor: The motor testing reveals 5 over 5 strength of all 4 extremities. Good symmetric motor tone is noted throughout.  ?Sensory: Sensory testing is intact to soft touch on all 4 extremities. No evidence of extinction is noted.  ?Coordination:  Cerebellar testing reveals good finger-nose-finger and heel-to-shin bilaterally.  ?Gait and station: Uses a walker when ambulating. ?Reflexes: Deep tendon reflexes are symmetric and normal bilaterally.  ? ?DIAGNOSTIC DATA (LABS, IMAGING, TESTING) ?- I reviewed patient records, labs, notes, testing and imaging myself where available. ? ?Lab Results  ?Component Value Date  ? WBC 9.2 03/26/2021  ? HGB 15.0 03/26/2021  ? HCT 45.4 03/26/2021  ? MCV 90.8 03/26/2021  ? PLT 197 03/26/2021  ? ?   ?Component Value Date/Time  ? NA 137 03/26/2021 0508  ? K 4.0 03/26/2021 0508  ? CL 103 03/26/2021 0508  ? CO2 26 03/26/2021 0508  ? GLUCOSE 143 (H) 03/26/2021 05/24/2021  ? BUN 14 03/26/2021  [id number]  ? CREATININE 0.95 04/01/2021 0600  ? CALCIUM 8.7 (L) 03/26/2021 [id number]  ? PROT 6.1 (L) 03/26/2021 [id number]  ? ALBUMIN 2.9 (L) 03/26/2021 [id number]  ? AST 36 03/26/2021 0508  ? ALT 51 (H) 03/26/2021 0508  ? ALKPHOS 65 03/26/2021 0508  ? BILITOT 0.8 03/26/2021 0508  ? GFRNONAA >60 04/01/2021 0600  ? ?Lab Results  ?Component Value Date  ? CHOL 131 03/22/2021  ? HDL 39 (L) 03/22/2021  ? LDLCALC 72 03/22/2021  ? TRIG 99 03/22/2021  ? CHOLHDL 3.4 03/22/2021  ? ?Lab Results  ?Component Value Date  ? HGBA1C 8.4 (H) 03/22/2021  ? ?No results found for: VITAMINB12 ?No results found for: TSH ? ? ? ?ASSESSMENT AND PLAN ?86 y.o. year old male  has a past medical history of Bilateral iliac artery aneurysm (HCC), Diabetes mellitus without complication (HCC), HTN (hypertension), Hyperlipidemia, Hypothyroid, Obesity, OSA (obstructive sleep apnea), and Peripheral edema. here with: ? ? ?1.  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 ?Continue aspirin and Plavix until next month and then can discontinue aspirin and continue Plavix 75 mg daily ?Keep good control of the blood pressure with goal less than 130/90.  Currently managed by PCP ?Keep good control of cholesterol with LDL goal less than 70 currently on Lipitor managed by PCP ?Keep  good control of diabetes with hemoglobin A1c goal less than 6.5%. ?Discussed smoking sensation-had a long discussion with the patient advising that smoking increases his risk for another stroke.  Patient is unde

## 2021-05-28 ENCOUNTER — Encounter: Payer: Self-pay | Admitting: Adult Health

## 2021-05-28 ENCOUNTER — Other Ambulatory Visit: Payer: Self-pay

## 2021-05-28 ENCOUNTER — Ambulatory Visit (INDEPENDENT_AMBULATORY_CARE_PROVIDER_SITE_OTHER): Payer: Medicare Other | Admitting: Adult Health

## 2021-05-28 ENCOUNTER — Telehealth: Payer: Self-pay | Admitting: Adult Health

## 2021-05-28 VITALS — BP 137/67 | HR 63 | Ht 68.0 in | Wt 261.4 lb

## 2021-05-28 DIAGNOSIS — IMO0001 Reserved for inherently not codable concepts without codable children: Secondary | ICD-10-CM

## 2021-05-28 DIAGNOSIS — G4733 Obstructive sleep apnea (adult) (pediatric): Secondary | ICD-10-CM | POA: Diagnosis not present

## 2021-05-28 DIAGNOSIS — E785 Hyperlipidemia, unspecified: Secondary | ICD-10-CM

## 2021-05-28 DIAGNOSIS — I63542 Cerebral infarction due to unspecified occlusion or stenosis of left cerebellar artery: Secondary | ICD-10-CM | POA: Diagnosis not present

## 2021-05-28 DIAGNOSIS — I10 Essential (primary) hypertension: Secondary | ICD-10-CM

## 2021-05-28 NOTE — Patient Instructions (Addendum)
Your Plan: ? ?Consider referral for sleep to get restarted on CPAP. ?BP goal <130/90 ?LDL goal <70 ?HgbA1c <6.5 % ?Stop Aspirin next month. Continue Plavix ?Maintain healthy diet and exercise ?Strongly encourage you to stop smoking ?If your symptoms worsen or you develop new symptoms please let us know.  ? ?Librarian, academic Programs:  ?Comprehensive Evaluation: includes clinical and in vehicle behind the wheel testing by OCCUPATIONAL THERAPIST. Programs have varying levels of adaptive controls available for trial.  ? ?Musician,  ?[address] [place], [place]  [id number] or [phone]:  Richelle Ito, OT/CDRS/CDI/SCDCM/Low  ? ? ?Thank you for coming to see [place] at [facility]. I hope we have been able to provide you high quality care today. ? ?You may receive a patient satisfaction survey over the next few weeks. We would appreciate your feedback and comments so that we may continue to improve ourselves and the health of our patients. ? ?

## 2021-05-28 NOTE — Addendum Note (Signed)
Addended by: Enedina Finner on: 05/28/2021 11:16 AM ? ? Modules accepted: Orders ? ?

## 2021-05-28 NOTE — Telephone Encounter (Signed)
Sent to Dr. Groat ph # [phone] 

## 2021-06-02 ENCOUNTER — Ambulatory Visit: Payer: Medicare Other | Admitting: Physical Therapy

## 2021-06-02 ENCOUNTER — Ambulatory Visit: Payer: Medicare Other | Admitting: Occupational Therapy

## 2021-06-04 NOTE — Progress Notes (Signed)
I agree with the above plan 

## 2021-06-22 ENCOUNTER — Telehealth: Payer: Self-pay | Admitting: Adult Health

## 2021-06-22 NOTE — Telephone Encounter (Signed)
Pt's daughter has called to report that pt was in hospital 2 weeks ago.  Pt daughter declined [name], NP's next available(which is in July) she is asking for a call  ?

## 2021-06-22 NOTE — Telephone Encounter (Signed)
I called and LMVM that was calling Samuel Norris back (using [facility] with Language Line ID [phone]).   ?

## 2021-06-22 NOTE — Telephone Encounter (Signed)
I called daughter, but needed to get interpreter.  She stated that pt was in the hospital [facility] for stroke since we last saw him. I called language interpreters Jonetta Speak ID [phone] had to [facility] that I called and would call back later.  Pt was seen in [facility] 3/21-23/2023 for stroke.   ?

## 2021-06-23 NOTE — Telephone Encounter (Signed)
I called daughter Patsy Lager again at [phone], [facility] per interpreter Reginia Forts ID [phone] to call back.  (This is 3rd time I have called and LM).   MM/NP and Pearlean Brownie.  ?

## 2021-08-06 ENCOUNTER — Ambulatory Visit (INDEPENDENT_AMBULATORY_CARE_PROVIDER_SITE_OTHER): Payer: Medicare Other | Admitting: Neurology

## 2021-08-06 ENCOUNTER — Encounter: Payer: Self-pay | Admitting: Neurology

## 2021-08-06 VITALS — BP 135/66 | HR 69 | Ht 67.0 in | Wt 263.4 lb

## 2021-08-06 DIAGNOSIS — I63542 Cerebral infarction due to unspecified occlusion or stenosis of left cerebellar artery: Secondary | ICD-10-CM | POA: Diagnosis not present

## 2021-08-06 DIAGNOSIS — G4733 Obstructive sleep apnea (adult) (pediatric): Secondary | ICD-10-CM | POA: Diagnosis not present

## 2021-08-06 DIAGNOSIS — Z789 Other specified health status: Secondary | ICD-10-CM

## 2021-08-06 DIAGNOSIS — R400 Somnolence: Secondary | ICD-10-CM

## 2021-08-06 NOTE — Patient Instructions (Signed)

## 2021-08-06 NOTE — Progress Notes (Signed)
Subjective:    Patient ID: Samuel Norris is a 86 y.o. male.  HPI    [name], MD, PhD [facility] [address] [address] [place], [place] [address]  Dear [name] and [name],   I saw your patient, Samuel Norris, upon your kind request in my sleep clinic today for initial consultation of his sleep disorder, in particular, evaluation of his prior diagnosis of sleep apnea.  The patient is accompanied by his daughter and [name] from the interpretation services today.  As you know, [name] is a 86 year old right-handed gentleman with an underlying medical history of hypertension, hyperlipidemia, diabetes, stroke, iliac artery aneurysm, peripheral edema, hypothyroidism and severe obesity with a BMI of over 40, who was previously diagnosed with obstructive sleep apnea and placed on PAP therapy.  Sleep testing was several years ago through [facility].  I was able to review an office visit note from 02/15/2019 with [name] chest specialists.  Reportedly she had severe sleep apnea and poor compliance with CPAP of 15 cm.  Reportedly, his original sleep study was from 12/11/2015 which showed an AHI of 60/h.  He was placed on CPAP of 15 cm.  His wife reported at the time that he was averaging about an hour per night.  He was seen by Dr. [name] at the time. He lives with his daughter.  He is separated from his wife.  He had trouble tolerating CPAP and is willing to get retested and consider CPAP again.  He would like to try a different mask as he had trouble with the full facemask.  He also reports that his dog chewed on his mask and he does not have any supplies currently.  He has an older machine.  He did not bring his machine.  He goes to bed at variable times, he does not have a set schedule.  He may sleep 10 to 12 hours and still feels sleepy.  His Epworth sleepiness score is 23 out of 24 today, fatigue severity score is 59 out of 63.  He may go to bed at  7 PM or as late as midnight, his rise time may be between 5 and 7 AM.  He has severe nocturia, about 4-5 times per average night.  He drinks little caffeine, 1 cup of coffee per day, no alcohol currently and quit smoking about 2 months ago.  They have 1 dog in the household.  He would be willing to get retested with a home sleep test but would like to avoid a laboratory study.  His Past Medical History Is Significant For: Past Medical History:  Diagnosis Date   Bilateral iliac artery aneurysm (HCC)    Diabetes mellitus without complication (HCC)    HTN (hypertension)    Hyperlipidemia    Hypothyroid    Obesity    OSA (obstructive sleep apnea)    Peripheral edema     His Past Surgical History Is Significant For: Past Surgical History:  Procedure Laterality Date   LEFT HEART CATH AND CORONARY ANGIOGRAPHY  03/2019   Mild Non-obstructive CAD    His Family History Is Significant For: Family History  Problem Relation Age of Onset   Stroke Father    Gout Other    Sleep apnea Neg Hx     His Social History Is Significant For: Social History   Socioeconomic History   Marital status: Divorced    Spouse name: Not on file   Number of children: Not on  file   Years of education: Not on file   Highest education level: Not on file  Occupational History   Not on file  Tobacco Use   Smoking status: Former    Packs/day: 1.00    Types: Cigarettes    Quit date: 05/13/2021    Years since quitting: 0.2   Smokeless tobacco: Not on file  Vaping Use   Vaping Use: Never used  Substance and Sexual Activity   Alcohol use: Not Currently   Drug use: Never   Sexual activity: Not on file  Other Topics Concern   Not on file  Social History Narrative   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  EducationWellsite geologist.  Has 6 grown children.     Social Determinants of Health   Financial Resource Strain: Not on file  Food Insecurity: Not on file  Transportation Needs: Not on file  Physical Activity:  Not on file  Stress: Not on file  Social Connections: Not on file    His Allergies Are:  No Known Allergies:   His Current Medications Are:  Outpatient Encounter Medications as of 08/06/2021  Medication Sig   acetaminophen (TYLENOL) 325 MG tablet Take 2 tablets (650 mg total) by mouth every 4 (four) hours as needed for mild pain (or temp > 37.5 C (99.5 F)).   aspirin 325 MG EC tablet take 1 tablet daily and Continue until 06/24/2021 and stop   atorvastatin (LIPITOR) 80 MG tablet Take 1 tablet (80 mg total) by mouth at bedtime.   budesonide-formoterol (SYMBICORT) 80-4.5 MCG/ACT inhaler Inhale 2 puffs into the lungs 2 (two) times daily.   clopidogrel (PLAVIX) 75 MG tablet Take 1 tablet (75 mg total) by mouth daily.   cyclobenzaprine (FLEXERIL) 10 MG tablet Take 1 tablet (10 mg total) by mouth every 8 (eight) hours as needed for muscle spasms.   Dulaglutide (TRULICITY) 0.75 MG/0.5ML SOPN INJECT 0.5 MLS (0.75 MG DOSE) INTO THE SKIN ONCE A WEEK AT 0900 FOR 4 DOSES.   fluorometholone (FML) 0.1 % ophthalmic suspension Place 1 drop into both eyes daily.   fluticasone (FLONASE) 50 MCG/ACT nasal spray Place 1 spray into both nostrils daily as needed for allergies.   levothyroxine (SYNTHROID) 88 MCG tablet Take 1 tablet (88 mcg total) by mouth daily.   linagliptin (TRADJENTA) 5 MG TABS tablet Take 1 tablet (5 mg total) by mouth daily.   loratadine (CLARITIN) 10 MG tablet Take 1 tablet (10 mg total) by mouth daily as needed for allergies.   losartan (COZAAR) 100 MG tablet Take 1 tablet (100 mg total) by mouth daily.   omeprazole (PRILOSEC) 40 MG capsule Take 1 capsule (40 mg total) by mouth daily.   tamsulosin (FLOMAX) 0.4 MG CAPS capsule Take 1 capsule (0.4 mg total) by mouth daily after supper.   topiramate (TOPAMAX) 25 MG tablet Take 1 tablet (25 mg total) by mouth at bedtime.   trospium (SANCTURA) 20 MG tablet Take 20 mg by mouth 2 (two) times daily.   No facility-administered encounter  medications on file as of 08/06/2021.  :   Review of Systems:  Out of a complete 14 point review of systems, all are reviewed and negative with the exception of these symptoms as listed below:   Review of Systems  Neurological:        Pt is here for sleep consult  pt states he snores,fatigue ,hypertension. . Pt denies headaches . Daughter states that pt had sleep study done 4 years ago and does have  CPAP machine at home but mask is damaged . Pt doesn't have CPAP machine with him today   ESS:23 FSS:59      Objective:  Neurological Exam  Physical Exam Physical Examination:   Vitals:   08/06/21 1026  BP: 135/66  Pulse: 69    General Examination: The patient is a very pleasant 86 y.o. male in no acute distress. He appears well-developed and well-nourished and well groomed.   HEENT: Normocephalic, atraumatic, pupils are equal, round and reactive to light, extraocular tracking is good without limitation to gaze excursion or nystagmus noted. Hearing is grossly intact. Face is symmetric with normal facial animation. Speech mild dysarthria noted. There is no hypophonia. There is no lip, neck/head, jaw or voice tremor. Neck is supple with full range of passive and active motion. There are no carotid bruits on auscultation. Oropharynx exam reveals: moderate mouth dryness, has dentures on top and is edentulous on the bottom.  He reports that his dentures were chewed on by his dog.  He has a wider uvula and moderate airway crowding, tonsils are absent.   Chest: Clear to auscultation without wheezing, rhonchi or crackles noted.  Heart: S1+S2+0, regular and normal without murmurs, rubs or gallops noted.   Abdomen: Soft, non-tender and non-distended.  Extremities: There is 1+ pitting edema in the distal lower extremities bilaterally.   Skin: Warm and dry without trophic changes noted.   Musculoskeletal: exam reveals no obvious joint deformities, tenderness or joint swelling or erythema.    Neurologically:  Mental status: The patient is awake, alert and oriented in all 4 spheres. His immediate and remote memory, attention, language skills and fund of knowledge are appropriate. There is no evidence of aphasia, agnosia, apraxia or anomia. Speech is clear with normal prosody and enunciation. Thought process is linear. Mood is normal and affect is normal.  Cranial nerves II - XII are as described above under HEENT exam.  Motor exam: Normal bulk, strength and tone is noted. There is no obvious tremor.  Fine motor skills and coordination: Mildly impaired globally.  .  Cerebellar testing: No dysmetria or intention tremor. There is no truncal or gait ataxia.  Sensory exam: intact to light touch in the upper and lower extremities.  Gait, station and balance: He stands with mild difficulty and walks with a cane.  Assessment and Plan:  In summary, Collis Thede is a very pleasant 86 y.o.-year old male with an underlying medical history of hypertension, hyperlipidemia, diabetes, stroke, iliac artery aneurysm, peripheral edema, hypothyroidism and severe obesity with a BMI of over 40, who presents for evaluation of his obstructive sleep apnea.  He was diagnosed with severe obstructive sleep apnea in 2017.  He is no longer using his CPAP machine.  He had trouble using it in the past.  We will proceed with re-evaluation with a home sleep test and proceed with treatment with most likely AutoPap therapy after testing. I had a long chat with the patient and his daughter about my findings and the diagnosis of OSA, its prognosis and treatment options. We talked about medical treatments, surgical interventions and non-pharmacological approaches. I explained in particular the risks and ramifications of untreated moderate to severe OSA, especially with respect to developing cardiovascular disease down the Road, including congestive heart failure, difficult to treat hypertension, cardiac arrhythmias, or  stroke. Even type 2 diabetes has, in part, been linked to untreated OSA. Symptoms of untreated OSA include daytime sleepiness, memory problems, mood irritability and mood disorder such as depression  and anxiety, lack of energy, as well as recurrent headaches, especially morning headaches. We talked about ongoing smoking cessation and trying to maintain a healthy lifestyle in general, as well as the importance of weight control. We also talked about the importance of good sleep hygiene. I recommended the following at this time: sleep study. He prefers a home sleep test.  I explained the sleep test procedure to the patient and also outlined possible surgical and non-surgical treatment options of OSA, including the use of a custom-made dental device (which would require a referral to a specialist dentist or oral surgeon), upper airway surgical options, such as traditional UPPP or a novel less invasive surgical option in the form of Inspire hypoglossal nerve stimulation (which would involve a referral to an ENT surgeon).  We will plan a follow up after testing. I answered all their questions today and the patient and his daughter were in agreement.  Thank you very much for allowing me to participate in the care of this nice patient. If I can be of any further assistance to you please do not hesitate to call me at [phone].  Sincerely,   Huston FoleySaima Dvontae Ruan, MD, PhD

## 2021-08-06 NOTE — Progress Notes (Deleted)
Subjective:    Patient ID: Samuel Norris is a 86 y.o. male.  HPI {Common ambulatory SmartLinks:[id number]}  Review of Systems  Objective:  Neurological Exam  Physical Exam  Assessment:   ***  Plan:   ***

## 2021-08-11 ENCOUNTER — Telehealth: Payer: Self-pay | Admitting: Neurology

## 2021-08-11 NOTE — Telephone Encounter (Signed)
HST- [employer] medicare/medicaid no auth req patient is scheduled for 08/19/21 at 10:30 AM.

## 2021-08-19 NOTE — Telephone Encounter (Signed)
Patient daughter Patsy Lager called and r/s for 6/20/2 at 10:30 AM.

## 2021-08-25 NOTE — Telephone Encounter (Signed)
Patient daughter Denman George called stating she could not do this day, she has r/s for 09/08/21 at 11:30 AM.

## 2021-09-08 ENCOUNTER — Ambulatory Visit: Payer: Medicare Other | Admitting: Neurology

## 2021-09-08 DIAGNOSIS — R400 Somnolence: Secondary | ICD-10-CM

## 2021-09-08 DIAGNOSIS — G4734 Idiopathic sleep related nonobstructive alveolar hypoventilation: Secondary | ICD-10-CM

## 2021-09-08 DIAGNOSIS — G4733 Obstructive sleep apnea (adult) (pediatric): Secondary | ICD-10-CM

## 2021-09-08 DIAGNOSIS — Z789 Other specified health status: Secondary | ICD-10-CM

## 2021-09-08 DIAGNOSIS — I63542 Cerebral infarction due to unspecified occlusion or stenosis of left cerebellar artery: Secondary | ICD-10-CM

## 2021-09-10 NOTE — Progress Notes (Signed)
See procedure note.

## 2021-09-11 NOTE — Addendum Note (Signed)
Addended by: [name] on: 09/11/2021 10:52 AM   Modules accepted: Orders

## 2021-09-11 NOTE — Procedures (Signed)
[facility]  HOME SLEEP TEST (Watch PAT) REPORT  STUDY DATE: 09/08/21  DOB: May 25, 1935  MRN: [id number]  ORDERING CLINICIAN: [name], MD, PhD   REFERRING CLINICIAN: Dr. Pearlean Brownie  CLINICAL INFORMATION/HISTORY: 86 year old right-handed gentleman with an underlying medical history of hypertension, hyperlipidemia, diabetes, stroke, iliac artery aneurysm, peripheral edema, hypothyroidism and severe obesity with a BMI of over 40, who was previously diagnosed with obstructive sleep apnea and placed on PAP therapy.  He is currently not on a PAP machine.  He had trouble tolerating it in the past.  Epworth sleepiness score: 23/24.  BMI: 41.2 kg/m  FINDINGS:   Sleep Summary:   Total Recording Time (hours, min): 8 hours, 33 minutes  Total Sleep Time (hours, min):  7 hours, 53 minutes   Percent REM (%):    16.1%   Respiratory Indices:   Calculated pAHI (per hour):  68/hour         REM pAHI:    66.7/hour       NREM pAHI: 68.2/hour  Oxygen Saturation Statistics:    Oxygen Saturation (%) Mean: 90%   Minimum oxygen saturation (%):                 68%   O2 Saturation Range (%): 68-97%    O2 Saturation (minutes) <=88%: 43.2 min  Pulse Rate Statistics:   Pulse Mean (bpm):    61/min    Pulse Range (43-94/min)   IMPRESSION: OSA (obstructive sleep apnea), severe Nocturnal hypoxemia  RECOMMENDATION:  This home sleep test demonstrates severe obstructive sleep apnea with a total AHI of 68/hour and O2 nadir of 68% with significant time below or at 88% saturation of over 40 minutes for the night, indicating nocturnal hypoxemia.  Snoring was noted, ranging from mild to louder. Treatment with positive airway pressure is highly recommended. This will require - ideally - a full night CPAP titration study for proper treatment settings, O2 monitoring and mask fitting. For now, the patient will be advised to proceed with an autoPAP titration/trial at home. A laboratory  attended titration study can be considered in the future for optimization of his treatment and better tolerance of therapy.  Alternative treatment options are limited secondary to the severity of the patient's sleep disordered breathing, and may include the Inspire device (hypoglossal nerve stimulator) in carefully selected patients (meeting current inclusion criteria).  Concomitant weight loss is highly recommended.  Please note, that untreated obstructive sleep apnea may carry additional perioperative morbidity. Patients with significant obstructive sleep apnea should receive perioperative PAP therapy and the surgeons and particularly the anesthesiologist should be informed of the diagnosis and the severity of the sleep disordered breathing. The patient should be cautioned not to drive, work at heights, or operate dangerous or heavy equipment when tired or sleepy. Review and reiteration of good sleep hygiene measures should be pursued with any patient. Other causes of the patient's symptoms, including circadian rhythm disturbances, an underlying mood disorder, medication effect and/or an underlying medical problem cannot be ruled out based on this test. Clinical correlation is recommended. The patient and his referring provider will be notified of the test results. The patient will be seen in follow up in sleep clinic at [facility].  I certify that I have reviewed the raw data recording prior to the issuance of this report in accordance with the standards of the American Academy of Sleep Medicine (AASM).   INTERPRETING PHYSICIAN:   [name], MD, PhD  Board Certified in Neurology and Sleep  Medicine  [facility] [address], [address] [place], [place] [address] [phone]

## 2021-09-17 ENCOUNTER — Telehealth: Payer: Self-pay | Admitting: *Deleted

## 2021-09-17 NOTE — Telephone Encounter (Signed)
-----   Message from [name], MD sent at 09/11/2021 10:51 AM EDT ----- Spanish interpreter may be needed if talking to patient, as I recall, daughter speaks [place] as well.  Patient referred by Dr. Pearlean Brownie and MM, seen by me on 08/06/2021, patient had a HST on 09/08/2021.    Please call and notify the patient that the recent home sleep test showed obstructive sleep apnea in the severe range. I recommend treatment for this in the form of autoPAP, which means, that we don't have to bring him in for a sleep study with CPAP, but will let him start using a so called autoPAP machine at home, through a DME company (of his choice, or as per insurance requirement). The DME representative will fit the patient with a mask of choice, educate him on how to use the machine, how to put the mask on, etc. I have placed an order in the chart. Please send the order to a local DME, talk to patient, send report to referring MD. Please also reinforce the need for compliance with treatment. We will need a FU in sleep clinic for 10 weeks post-PAP set up, please arrange that with me or one of our NPs. Thanks,   [name], MD, PhD [facility])

## 2021-09-17 NOTE — Telephone Encounter (Signed)
I called the pt's daughter Patsy Lager (on [place]) using pacific interpreters Harrison Mons [phone]). LVM (ok per DPR) asking for her to call us back to discuss pt's sleep study results. Will try to call her again on another day during office hours.

## 2021-09-22 NOTE — Telephone Encounter (Signed)
I called the pt's daughter Patsy Lager (on [address]) using pacific interpreters Lars Mage [phone]). LVM (ok per DPR) asking for her to call us back to discuss pt's sleep study results.

## 2021-09-30 ENCOUNTER — Encounter: Payer: Self-pay | Admitting: *Deleted

## 2021-09-30 NOTE — Telephone Encounter (Signed)
Opened this in error 

## 2021-10-05 ENCOUNTER — Encounter: Payer: Self-pay | Admitting: *Deleted

## 2021-10-05 NOTE — Telephone Encounter (Signed)
Pt's daughter returned call. I used [employer] to call her back to provide assistance as needed Samuel Norris [phone]) and we discussed the pt's sleep study results. Pt's daughter verbalized understanding. She is amenable to proceeding with autoPAP for the patient. He has used CPAP before. Discussed DME. Will use [employer] in [place] in pt/daughter's city of residence. She will watch for a call from them within 1 week and is aware [employer] will show them how to use machine, fit with mask, show how to put mask on and care for machine. She was provided the number to call if needed. Also discussed insurance compliance requirements which includes using the machine at least 4 hours at night and also being seen for f/u between 30 and 90 days after setup. Pt has been scheduled for an initial f/u on 12/17/21 at 945 AM arrival 15-30 minutes early with machine & power cord. Letter to be mailed to their home. Daughter states she can read [place]. She verbalized appreciation for the call and her questions were answered.   Sleep study result sent to Dr Pearlean Brownie. Letter mailed to the home. AutoPAP order, insurance info, sleep study result, and office note all faxed to [facility]). Received a receipt of confirmation.

## 2021-11-11 NOTE — Telephone Encounter (Signed)
Tiarah with [employer] called and said they only have an Airsense 10 with no modem and is this ok to provide instead of an 11 w/ modem. I checked with Dr Frances Furbish and let Debbe Bales know this is ok. They will provide card with machine and will tell patient & daughter that they will need to bring the machine to every appt at [facility].

## 2021-12-09 NOTE — Telephone Encounter (Signed)
Order for [employer] (cpap and supplies singed and faxed back to) with fax confirmation received. [phone].

## 2021-12-17 ENCOUNTER — Ambulatory Visit: Payer: Medicare Other | Admitting: Neurology

## 2022-01-11 ENCOUNTER — Ambulatory Visit (INDEPENDENT_AMBULATORY_CARE_PROVIDER_SITE_OTHER): Payer: Medicare Other | Admitting: Neurology

## 2022-01-11 ENCOUNTER — Encounter: Payer: Self-pay | Admitting: Neurology

## 2022-01-11 VITALS — BP 161/73 | HR 64 | Ht 67.0 in | Wt 275.2 lb

## 2022-01-11 DIAGNOSIS — Z789 Other specified health status: Secondary | ICD-10-CM | POA: Diagnosis not present

## 2022-01-11 DIAGNOSIS — G4733 Obstructive sleep apnea (adult) (pediatric): Secondary | ICD-10-CM | POA: Diagnosis not present

## 2022-01-11 NOTE — Patient Instructions (Signed)
It was nice to see you both again today. Please continue using your CPAP regularly. While your insurance requires that you use CPAP at least 4 hours each night on 70% of the nights, I recommend, that you not skip any nights and use it throughout the night if you can. Getting used to CPAP and staying with the treatment long term does take time and patience and discipline. Untreated obstructive sleep apnea when it is moderate to severe can have an adverse impact on cardiovascular health and raise her risk for heart disease, arrhythmias, hypertension, congestive heart failure, stroke and diabetes. Untreated obstructive sleep apnea causes sleep disruption, nonrestorative sleep, and sleep deprivation. This can have an impact on your day to day functioning and cause daytime sleepiness and impairment of cognitive function, memory loss, mood disturbance, and problems focussing. Using CPAP regularly can improve these symptoms. Please follow-up to see Frann Rider, NP in 6 months.

## 2022-01-11 NOTE — Progress Notes (Signed)
Subjective:    Patient ID: Samuel Norris is a 86 y.o. male.  HPI    Interim history:   Samuel Norris is an 86 year old right-handed gentleman with an underlying medical history of hypertension, hyperlipidemia, diabetes, stroke, iliac artery aneurysm, peripheral edema, hypothyroidism and severe obesity with a BMI of over 23, who presents for follow-up consultation of his obstructive sleep apnea after interim testing and starting AutoPap therapy.  The patient is accompanied by his daughter, Samuel Norris and a Spanish interpreter today first met him at the request of Dr. Leonie Norris on 08/06/2021, at which time he reported a prior diagnosis of sleep apnea but he had not used his CPAP due to difficulty tolerating it.  I suggested we proceed with a home sleep test.  He had a home sleep test on 09/08/2021 which showed severe obstructive sleep apnea with a total AHI of 68/hour and O2 nadir of 68% with significant time below or at 88% saturation of over 40 minutes for the night, indicating nocturnal hypoxemia.  Snoring was noted, ranging from mild to louder.  He was advised to proceed with AutoPap therapy at home.  His set up date was 11/11/2021.  He has a [employer] AirSense 10 AutoSet machine with card reader.  Today, 01/11/2022: I reviewed his AutoPap compliance data from the most recent 30 days from 12/12/2021 through 01/10/2022, during which time he used his machine every night with percent use days greater than 4 hours at 30% only, indicating suboptimal compliance, average usage of 3 hours and 52 minutes, residual AHI borderline at 5.8/h, leak on the higher side with the 95th percentile at 26.4 L/min.  His compliance for more than 4 hours was better in the first month, compliance from 11/11/2021 through 12/10/2021 showed percent use days greater than 4 hours at 73%, indicating adequate compliance with an average usage of 5 hours and 12 minutes, residual AHI was higher at the time at 8.1/h, leak also a lot higher with the  95th percentile at 38 L/min.   In the beginning he had a nasal mask per daughter.  He then switched to a fullface mask.  He reports that he was more excited about using his CPAP in the beginning but is motivated to continue with treatment.  He also used his AutoPap when he was napping in the first month.  He is noticing better ability to bring it with a full facemask as he had a lot of nasal congestion and drainage, almost felt like he had flulike symptoms when he first started treatment.  He is able to breathe through his mouth but does have some mouth dryness also.  He uses the humidifier with distilled water.  The patient's allergies, current medications, family history, past medical history, past social history, past surgical history and problem list were reviewed and updated as appropriate.   Previously:   08/06/21: (He) was previously diagnosed with obstructive sleep apnea and placed on PAP therapy.  Sleep testing was several years ago through [facility].  I was able to review an office visit note from 02/15/2019 with [facility] chest specialists.  Reportedly she had severe sleep apnea and poor compliance with CPAP of 15 cm.  Reportedly, his original sleep study was from 12/11/2015 which showed an AHI of 60/h.  He was placed on CPAP of 15 cm.  His wife reported at the time that he was averaging about an hour per night.  He was seen by Dr. Lourdes Norris at the time. He lives with his daughter.  He is separated from his wife.  He had trouble tolerating CPAP and is willing to get retested and consider CPAP again.  He would like to try a different mask as he had trouble with the full facemask.  He also reports that his dog chewed on his mask and he does not have any supplies currently.  He has an older machine.  He did not bring his machine.  He goes to bed at variable times, he does not have a set schedule.  He Norris sleep 10 to 12 hours and still feels sleepy.  His Epworth sleepiness score is 23 out of 24 today,  fatigue severity score is 59 out of 63.  He Norris go to bed at 7 PM or as late as midnight, his rise time Norris be between 5 and 7 AM.  He has severe nocturia, about 4-5 times per average night.  He drinks little caffeine, 1 cup of coffee per day, no alcohol currently and quit smoking about 2 months ago.  They have 1 dog in the household.  He would be willing to get retested with a home sleep test but would like to avoid a laboratory study.   His Past Medical History Is Significant For: Past Medical History:  Diagnosis Date   Bilateral iliac artery aneurysm (HCC)    Diabetes mellitus without complication (HCC)    HTN (hypertension)    Hyperlipidemia    Hypothyroid    Obesity    OSA (obstructive sleep apnea)    Peripheral edema     His Past Surgical History Is Significant For: Past Surgical History:  Procedure Laterality Date   LEFT HEART CATH AND CORONARY ANGIOGRAPHY  03/2019   Mild Non-obstructive CAD    His Family History Is Significant For: Family History  Problem Relation Age of Onset   Stroke Father    Gout Other    Sleep apnea Neg Hx     His Social History Is Significant For: Social History   Socioeconomic History   Marital status: Divorced    Spouse name: Not on file   Number of children: Not on file   Years of education: Not on file   Highest education level: Not on file  Occupational History   Not on file  Tobacco Use   Smoking status: Former    Packs/day: 1.00    Types: Cigarettes    Quit date: 05/13/2021    Years since quitting: 0.6   Smokeless tobacco: Not on file  Vaping Use   Vaping Use: Never used  Substance and Sexual Activity   Alcohol use: Not Currently   Drug use: Never   Sexual activity: Not on file  Other Topics Concern   Not on file  Social History Narrative   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     Social Determinants of Health   Financial Resource Strain: Not on file  Food Insecurity: Not on  file  Transportation Needs: Not on file  Physical Activity: Not on file  Stress: Not on file  Social Connections: Not on file    His Allergies Are:  No Known Allergies:   His Current Medications Are:  Outpatient Encounter Medications as of 01/11/2022  Medication Sig   acetaminophen (TYLENOL) 325 MG tablet Take 2 tablets (650 mg total) by mouth every 4 (four) hours as needed for mild pain (or temp > 37.5 C (99.5 F)).   aspirin 325 MG EC tablet take 1 tablet  daily and Continue until 06/24/2021 and stop   atorvastatin (LIPITOR) 80 MG tablet Take 1 tablet (80 mg total) by mouth at bedtime.   budesonide-formoterol (SYMBICORT) 80-4.5 MCG/ACT inhaler Inhale 2 puffs into the lungs 2 (two) times daily.   clopidogrel (PLAVIX) 75 MG tablet Take 1 tablet (75 mg total) by mouth daily.   cyclobenzaprine (FLEXERIL) 10 MG tablet Take 1 tablet (10 mg total) by mouth every 8 (eight) hours as needed for muscle spasms.   Dulaglutide (TRULICITY) 7.74 JO/8.7OM SOPN INJECT 0.5 MLS (0.75 MG DOSE) INTO THE SKIN ONCE A WEEK AT 0900 FOR 4 DOSES.   fluorometholone (FML) 0.1 % ophthalmic suspension Place 1 drop into both eyes daily.   fluticasone (FLONASE) 50 MCG/ACT nasal spray Place 1 spray into both nostrils daily as needed for allergies.   levothyroxine (SYNTHROID) 88 MCG tablet Take 1 tablet (88 mcg total) by mouth daily.   linagliptin (TRADJENTA) 5 MG TABS tablet Take 1 tablet (5 mg total) by mouth daily.   loratadine (CLARITIN) 10 MG tablet Take 1 tablet (10 mg total) by mouth daily as needed for allergies.   losartan (COZAAR) 100 MG tablet Take 1 tablet (100 mg total) by mouth daily.   omeprazole (PRILOSEC) 40 MG capsule Take 1 capsule (40 mg total) by mouth daily.   tamsulosin (FLOMAX) 0.4 MG CAPS capsule Take 1 capsule (0.4 mg total) by mouth daily after supper.   topiramate (TOPAMAX) 25 MG tablet Take 1 tablet (25 mg total) by mouth at bedtime.   trospium (SANCTURA) 20 MG tablet Take 20 mg by mouth 2 (two)  times daily.   No facility-administered encounter medications on file as of 01/11/2022.  :  Review of Systems:  Out of a complete 14 point review of systems, all are reviewed and negative with the exception of these symptoms as listed below:  Review of Systems  Neurological:        .Doing ok,  using cpap, got a different mask and is working well. ESS     Objective:  Neurological Exam  Physical Exam Physical Examination:   Vitals:   01/11/22 1011  BP: (!) 161/73  Pulse: 64    General Examination: The patient is a very pleasant 86 y.o. male in no acute distress. He appears well-developed and well-nourished and well groomed.   HEENT: Normocephalic, atraumatic, pupils are equal, round and reactive to light, extraocular tracking is good without limitation to gaze excursion or nystagmus noted. Hearing is grossly intact. Face is symmetric with normal facial animation. Speech mild dysarthria noted. There is no hypophonia. There is no lip, neck/head, jaw or voice tremor. Neck is supple with full range of passive and active motion. There are no carotid bruits on auscultation. Oropharynx exam reveals: moderate mouth dryness, has dentures on top and is edentulous on the bottom. Moderate airway crowding, tonsils are absent.    Chest: Clear to auscultation without wheezing, rhonchi or crackles noted.   Heart: S1+S2+0, regular and normal without murmurs, rubs or gallops noted.    Abdomen: Soft, non-tender and non-distended.   Extremities: There is some mild swelling noted in the distal lower extremities bilaterally.    Skin: Warm and dry without trophic changes noted.    Musculoskeletal: exam reveals no obvious joint deformities.    Neurologically:  Mental status: The patient is awake, alert and oriented in all 4 spheres. His immediate and remote memory, attention, language skills and fund of knowledge are appropriate. There is no evidence of aphasia, agnosia,  apraxia or anomia. Speech is  clear with normal prosody and enunciation. Thought process is linear. Mood is normal and affect is normal.  Cranial nerves II - XII are as described above under HEENT exam.  Motor exam: Normal bulk, strength and tone is noted. There is no obvious tremor.  Fine motor skills and coordination: Mildly impaired globally.  .  Cerebellar testing: No dysmetria or intention tremor. There is no truncal or gait ataxia.  Sensory exam: intact to light touch in the upper and lower extremities.  Gait, station and balance: He stands with mild difficulty and walks with a cane.   Assessment and Plan:  In summary, Samuel Norris is a very pleasant 86 year old male with an underlying medical history of hypertension, hyperlipidemia, diabetes, stroke, iliac artery aneurysm, peripheral edema, hypothyroidism and severe obesity with a BMI of over 80, who presents for follow-up consultation of his obstructive sleep apnea after interim testing and starting AutoPap therapy. He carries a prior diagnosis of sleep apnea but he had not used his CPAP due to difficulty tolerating it. He had a home sleep test on 09/08/2021 which showed severe obstructive sleep apnea with a total AHI of 68/hour and O2 nadir of 68%. He established treatment with AutoPap since 11/11/2021.  He was compliant with treatment in the beginning month with better average usage, but has been more consistent with his AutoPap lately although his average usage has declined.  Switching from a nasal interface to a fullface mask to help reduce his leak and improve his residual AHI.  He is advised to be consistent with his AutoPap and also use it when he plans to nap.  We talked about his sleep apnea improvement and he is motivated to continue with treatment.  He is advised to follow-up routinely to see one of our nurse practitioners in 6 months, sooner if needed.  Questions today and the patient and his daughter were in agreement. I spent 30 minutes in total face-to-face  time and in reviewing records during pre-charting, more than 50% of which was spent in counseling and coordination of care, reviewing test results, reviewing medications and treatment regimen and/or in discussing or reviewing the diagnosis of OSA, the prognosis and treatment options. Pertinent laboratory and imaging test results that were available during this visit with the patient were reviewed by me and considered in my medical decision making (see chart for details).

## 2022-06-14 ENCOUNTER — Telehealth: Payer: Self-pay | Admitting: Adult Health

## 2022-06-14 ENCOUNTER — Encounter: Payer: Self-pay | Admitting: Adult Health

## 2022-06-14 NOTE — Telephone Encounter (Signed)
LVM and sent letter in mail informing pt of need to reschedule 06/29/22 appointment - NP out

## 2022-06-29 ENCOUNTER — Ambulatory Visit: Payer: Medicare Other | Admitting: Adult Health

## 2022-09-14 NOTE — Progress Notes (Signed)
Patient: Samuel Norris Date of Birth: September 26, 1935  Reason for Visit: Follow up History from: Patient, daughter, interpreter Primary Neurologist: Samuel Norris   ASSESSMENT AND PLAN 87 y.o. year old male   1.  Severe OSA  -I do not have an up-to-date download to review -He will restart CPAP, I will call him in 5 weeks and his daughter will bring the card by for download for my review -We discussed the importance of nightly CPAP usage for a minimum of 4 hours for clinical benefit and insurance requirement -She seems motivated to resume.  We discussed that untreated sleep apnea can be a risk factor for recurrent stroke.  His HST showed severe OSA with AHI 63/hour -Can discuss his runny nose concerns with his primary care doctor -We will schedule for 54-month follow-up  HISTORY OF PRESENT ILLNESS: Today 09/15/22 Here today for follow-up.  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  As result, referred to Dr. Frances Norris for sleep consult.  HST June 2023 showed severe OSA with AHI 63/hour.  Started AutoPap since August 2023.  Via Spanish interpreter and his daughter.  He did not bring his CPAP card. Cannot pull a download electronically. Uses full face mask. He doesn't like CPAP. He gets runny nose, feels like suffocating. Doesn't use nightly. Hasn't used in several months. The full mask is better than the nasal mask in regards to his runny nose. Has runny nose regardless of CPAP. Uses nasal spray. Hasn't mentioned to PCP. When using CPAP daughter felt his energy was better.   HISTORY 01/11/22 Dr. Frances Norris: Mr. Samuel Norris is an 87 year old right-handed gentleman with an underlying medical history of hypertension, hyperlipidemia, diabetes, stroke, iliac artery aneurysm, peripheral edema, hypothyroidism and severe obesity with a BMI of over 40, who presents for follow-up consultation of his obstructive sleep apnea after interim testing and starting AutoPap  therapy.  The patient is accompanied by his daughter, Samuel Norris and a Spanish interpreter today first met him at the request of Dr. Pearlean Norris on 08/06/2021, at which time he reported a prior diagnosis of sleep apnea but he had not used his CPAP due to difficulty tolerating it.  I suggested we proceed with a home sleep test.  He had a home sleep test on 09/08/2021 which showed severe obstructive sleep apnea with a total AHI of 68/hour and O2 nadir of 68% with significant time below or at 88% saturation of over 40 minutes for the night, indicating nocturnal hypoxemia.  Snoring was noted, ranging from mild to louder.  He was advised to proceed with AutoPap therapy at home.  His set up date was 11/11/2021.  He has a ResMed AirSense 10 AutoSet machine with card reader.   Today, 01/11/2022: I reviewed his AutoPap compliance data from the most recent 30 days from 12/12/2021 through 01/10/2022, during which time he used his machine every night with percent use days greater than 4 hours at 30% only, indicating suboptimal compliance, average usage of 3 hours and 52 minutes, residual AHI borderline at 5.8/h, leak on the higher side with the 95th percentile at 26.4 L/min.  His compliance for more than 4 hours was better in the first month, compliance from 11/11/2021 through 12/10/2021 showed percent use days greater than 4 hours at 73%, indicating adequate compliance with an average usage of 5 hours and 12 minutes, residual AHI was higher at the time at 8.1/h, leak also a lot higher with the 95th percentile at 38 L/min.   In  the beginning he had a nasal mask per daughter.  He then switched to a fullface mask.  He reports that he was more excited about using his CPAP in the beginning but is motivated to continue with treatment.  He also used his AutoPap when he was napping in the first month.  He is noticing better ability to bring it with a full facemask as he had a lot of nasal congestion and drainage, almost felt like he had flulike  symptoms when he first started treatment.  He is able to breathe through his mouth but does have some mouth dryness also.  He uses the humidifier with distilled water.  REVIEW OF SYSTEMS: Out of a complete 14 system review of symptoms, the patient complains only of the following symptoms, and all other reviewed systems are negative.  See HPI  ALLERGIES: No Known Allergies  HOME MEDICATIONS: Outpatient Medications Prior to Visit  Medication Sig Dispense Refill   acetaminophen (TYLENOL) 325 MG tablet Take 2 tablets (650 mg total) by mouth every 4 (four) hours as needed for mild pain (or temp > 37.5 C (99.5 F)).     atorvastatin (LIPITOR) 80 MG tablet Take 1 tablet (80 mg total) by mouth at bedtime. 30 tablet 0   clopidogrel (PLAVIX) 75 MG tablet Take 1 tablet (75 mg total) by mouth daily. 90 tablet 0   cyclobenzaprine (FLEXERIL) 10 MG tablet Take 1 tablet (10 mg total) by mouth every 8 (eight) hours as needed for muscle spasms. 30 tablet 0   fluticasone (FLONASE) 50 MCG/ACT nasal spray Place 1 spray into both nostrils daily as needed for allergies.     levothyroxine (SYNTHROID) 88 MCG tablet Take 1 tablet (88 mcg total) by mouth daily. 30 tablet 0   linagliptin (TRADJENTA) 5 MG TABS tablet Take 1 tablet (5 mg total) by mouth daily. 30 tablet 0   loratadine (CLARITIN) 10 MG tablet Take 1 tablet (10 mg total) by mouth daily as needed for allergies. 30 tablet 0   losartan (COZAAR) 100 MG tablet Take 1 tablet (100 mg total) by mouth daily. 30 tablet 0   MOUNJARO 2.5 MG/0.5ML Pen Inject 2.5 mg into the skin once a week.     omeprazole (PRILOSEC) 40 MG capsule Take 1 capsule (40 mg total) by mouth daily. 30 capsule 0   tamsulosin (FLOMAX) 0.4 MG CAPS capsule Take 1 capsule (0.4 mg total) by mouth daily after supper. 30 capsule 0   topiramate (TOPAMAX) 25 MG tablet Take 1 tablet (25 mg total) by mouth at bedtime. 30 tablet 0   trospium (SANCTURA) 20 MG tablet Take 20 mg by mouth 2 (two) times daily.      aspirin 325 MG EC tablet take 1 tablet daily and Continue until 06/24/2021 and stop (Patient not taking: Reported on 09/15/2022) 30 tablet 0   budesonide-formoterol (SYMBICORT) 80-4.5 MCG/ACT inhaler Inhale 2 puffs into the lungs 2 (two) times daily. (Patient not taking: Reported on 09/15/2022) 10.2 g 12   Dulaglutide (TRULICITY) 0.75 MG/0.5ML SOPN INJECT 0.5 MLS (0.75 MG DOSE) INTO THE SKIN ONCE A WEEK AT 0900 FOR 4 DOSES. (Patient not taking: Reported on 09/15/2022)     fluorometholone (FML) 0.1 % ophthalmic suspension Place 1 drop into both eyes daily. (Patient not taking: Reported on 09/15/2022)     No facility-administered medications prior to visit.    PAST MEDICAL HISTORY: Past Medical History:  Diagnosis Date   Bilateral iliac artery aneurysm (HCC)    Diabetes mellitus without  complication (HCC)    HTN (hypertension)    Hyperlipidemia    Hypothyroid    Obesity    OSA (obstructive sleep apnea)    Peripheral edema     PAST SURGICAL HISTORY: Past Surgical History:  Procedure Laterality Date   LEFT HEART CATH AND CORONARY ANGIOGRAPHY  03/2019   Mild Non-obstructive CAD    FAMILY HISTORY: Family History  Problem Relation Age of Onset   Stroke Father    Gout Other    Sleep apnea Neg Hx     SOCIAL HISTORY: Social History   Socioeconomic History   Marital status: Divorced    Spouse name: Not on file   Number of children: Not on file   Years of education: Not on file   Highest education level: Not on file  Occupational History   Not on file  Tobacco Use   Smoking status: Former    Packs/day: 1    Types: Cigarettes    Quit date: 05/13/2021    Years since quitting: 1.3   Smokeless tobacco: Not on file  Vaping Use   Vaping Use: Never used  Substance and Sexual Activity   Alcohol use: Not Currently   Drug use: Never   Sexual activity: Not on file  Other Topics Concern   Not on file  Social History Narrative   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     Social Determinants of Health   Financial Resource Strain: Not on file  Food Insecurity: Not on file  Transportation Needs: Not on file  Physical Activity: Not on file  Stress: Not on file  Social Connections: Not on file  Intimate Partner Violence: Not on file    PHYSICAL EXAM  Vitals:   09/15/22 0942  BP: (!) 142/83  Pulse: 75  Weight: 265 lb 9.6 oz (120.5 kg)  Height: 5\' 7"  (1.702 m)   Body mass index is 41.6 kg/m.  Generalized: Well developed, in no acute distress, obese elderly male Neurological examination  Mentation: Alert oriented to time, place, history taking. Follows all commands speech and language fluent via Spanish interpreter. Cranial nerve II-XII: Pupils were equal round reactive to light. Extraocular movements were full, visual field were full on confrontational test. Facial sensation and strength were normal. Head turning and shoulder shrug  were normal and symmetric. Motor: The motor testing reveals 5 over 5 strength of all 4 extremities. Good symmetric motor tone is noted throughout.  Sensory: Sensory testing is intact to soft touch on all 4 extremities. No evidence of extinction is noted.  Coordination: Cerebellar testing reveals good finger-nose-finger bilaterally Gait and station: Gait is wide-based, uses rolling walker, cautious  DIAGNOSTIC DATA (LABS, IMAGING, TESTING) - I reviewed patient records, labs, notes, testing and imaging myself where available.  Lab Results  Component Value Date   WBC 9.2 03/26/2021   HGB 15.0 03/26/2021   HCT 45.4 03/26/2021   MCV 90.8 03/26/2021   PLT 197 03/26/2021      Component Value Date/Time   NA 137 03/26/2021 0508   K 4.0 03/26/2021 0508   CL 103 03/26/2021 0508   CO2 26 03/26/2021 0508   GLUCOSE 143 (H) 03/26/2021 0508   BUN 14 03/26/2021 0508   CREATININE 0.95 04/01/2021 0600   CALCIUM 8.7 (L) 03/26/2021 0508   PROT 6.1 (L) 03/26/2021 0508   ALBUMIN 2.9 (L) 03/26/2021 0508    AST 36 03/26/2021 0508   ALT 51 (H) 03/26/2021 9604  ALKPHOS 65 03/26/2021 0508   BILITOT 0.8 03/26/2021 0508   GFRNONAA >60 04/01/2021 0600   Lab Results  Component Value Date   CHOL 131 03/22/2021   HDL 39 (L) 03/22/2021   LDLCALC 72 03/22/2021   TRIG 99 03/22/2021   CHOLHDL 3.4 03/22/2021   Lab Results  Component Value Date   HGBA1C 8.4 (H) 03/22/2021   No results found for: "VITAMINB12" No results found for: "TSH"  Margie Ege, AGNP-C, DNP 09/15/2022, 10:44 AM [facility] [address], [address] [place], [place] [address] [phone]

## 2022-09-15 ENCOUNTER — Encounter: Payer: Self-pay | Admitting: Neurology

## 2022-09-15 ENCOUNTER — Ambulatory Visit (INDEPENDENT_AMBULATORY_CARE_PROVIDER_SITE_OTHER): Payer: 59 | Admitting: Neurology

## 2022-09-15 VITALS — BP 142/83 | HR 75 | Ht 67.0 in | Wt 265.6 lb

## 2022-09-15 DIAGNOSIS — G4733 Obstructive sleep apnea (adult) (pediatric): Secondary | ICD-10-CM | POA: Diagnosis not present

## 2022-09-15 NOTE — Patient Instructions (Addendum)
Use CPAP nightly for minimum of 4 hours. I will call you in 5 weeks to bring in your CPAP card for a download.

## 2022-10-20 ENCOUNTER — Telehealth: Payer: Self-pay | Admitting: Neurology

## 2022-10-20 NOTE — Telephone Encounter (Signed)
1st attempt lvm with interpreter

## 2022-10-20 NOTE — Telephone Encounter (Signed)
Please call patient, and ask his daughter to bring CPAP card in for download to review. Thanks Below is my last office note 09/15/22:  1.  Severe OSA  -I do not have an up-to-date download to review -He will restart CPAP, I will call him in 5 weeks and his daughter will bring the card by for download for my review -We discussed the importance of nightly CPAP usage for a minimum of 4 hours for clinical benefit and insurance requirement -She seems motivated to resume.  We discussed that untreated sleep apnea can be a risk factor for recurrent stroke.  His HST showed severe OSA with AHI 63/hour -Can discuss his runny nose concerns with his primary care doctor -We will schedule for 40-month follow-up

## 2022-10-21 NOTE — Telephone Encounter (Signed)
2nd attempt Interpreter id [phone] Used interpreter services to call pt and left msg on [phone]

## 2023-04-05 ENCOUNTER — Ambulatory Visit: Payer: 59 | Admitting: Neurology

## 2023-06-13 NOTE — Progress Notes (Unsigned)
 Patient: Samuel Norris Date of Birth: [birth year]  Reason for Visit: Follow up History from: Patient, daughter, interpreter Primary Neurologist: Samuel Norris   ASSESSMENT AND PLAN 88 y.o. year old male   1.  Severe OSA  2.  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  We reviewed HST in June 2023 showing severe OSA with total AHI 68/hour.  He is motivated to resume nightly CPAP use for minimum 4 hours.  He understands the risk of untreated sleep apnea.  I recommended increasing the humidity, considering a biotin spray for moisture.  If he can use his CPAP consistently I can further review the data to see if any other adjustments need to be made.  For now, we will continue current settings.  He will follow-up in 6 months or sooner if needed.  He is a Network engineer.  HISTORY OF PRESENT ILLNESS: Today 06/14/23 Review of CPAP data of the last 90 days shows AHI 4.2, leak 27, usage 28%, greater than 4 hours 0%.  6 to 14 cm water. Here with daughter and interpreter. CPAP use is not doing well. He reports dry mouth with CPAP, he is not using it the entire night. Using FFM. He tried a nasal mask, didn't work for him.   09/15/22 SS: Here today for follow-up.  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  As result, referred to Dr. Frances Norris for sleep consult.  HST June 2023 showed severe OSA with AHI 63/hour.  Started AutoPap since August 2023.  Via Spanish interpreter and his daughter.  He did not bring his CPAP card. Cannot pull a download electronically. Uses full face mask. He doesn't like CPAP. He gets runny nose, feels like suffocating. Doesn't use nightly. Hasn't used in several months. The full mask is better than the nasal mask in regards to his runny nose. Has runny nose regardless of CPAP. Uses nasal spray. Hasn't mentioned to PCP. When using CPAP daughter felt his energy was better.    HISTORY 01/11/22 Dr. Frances Norris: Mr. Samuel Norris is an 88 year old right-handed gentleman with an underlying medical history of hypertension, hyperlipidemia, diabetes, stroke, iliac artery aneurysm, peripheral edema, hypothyroidism and severe obesity with a BMI of over 40, who presents for follow-up consultation of his obstructive sleep apnea after interim testing and starting AutoPap therapy.  The patient is accompanied by his daughter, Samuel Norris and a Spanish interpreter today first met him at the request of Dr. Pearlean Norris on 08/06/2021, at which time he reported a prior diagnosis of sleep apnea but he had not used his CPAP due to difficulty tolerating it.  I suggested we proceed with a home sleep test.  He had a home sleep test on 09/08/2021 which showed severe obstructive sleep apnea with a total AHI of 68/hour and O2 nadir of 68% with significant time below or at 88% saturation of over 40 minutes for the night, indicating nocturnal hypoxemia.  Snoring was noted, ranging from mild to louder.  He was advised to proceed with AutoPap therapy at home.  His set up date was 11/11/2021.  He has a ResMed AirSense 10 AutoSet machine with card reader.   Today, 01/11/2022: I reviewed his AutoPap compliance data from the most recent 30 days from 12/12/2021 through 01/10/2022, during which time he used his machine every night with percent use days greater than 4 hours at 30% only, indicating suboptimal compliance, average usage of 3 hours and  52 minutes, residual AHI borderline at 5.8/h, leak on the higher side with the 95th percentile at 26.4 L/min.  His compliance for more than 4 hours was better in the first month, compliance from 11/11/2021 through 12/10/2021 showed percent use days greater than 4 hours at 73%, indicating adequate compliance with an average usage of 5 hours and 12 minutes, residual AHI was higher at the time at 8.1/h, leak also a lot higher with the 95th percentile at 38 L/min.   In the beginning he had a nasal mask  per daughter.  He then switched to a fullface mask.  He reports that he was more excited about using his CPAP in the beginning but is motivated to continue with treatment.  He also used his AutoPap when he was napping in the first month.  He is noticing better ability to bring it with a full facemask as he had a lot of nasal congestion and drainage, almost felt like he had flulike symptoms when he first started treatment.  He is able to breathe through his mouth but does have some mouth dryness also.  He uses the humidifier with distilled water.  REVIEW OF SYSTEMS: Out of a complete 14 system review of symptoms, the patient complains only of the following symptoms, and all other reviewed systems are negative.  See HPI  ALLERGIES: No Known Allergies  HOME MEDICATIONS: Outpatient Medications Prior to Visit  Medication Sig Dispense Refill   acetaminophen (TYLENOL) 325 MG tablet Take 2 tablets (650 mg total) by mouth every 4 (four) hours as needed for mild pain (or temp > 37.5 C (99.5 F)).     aspirin 325 MG EC tablet take 1 tablet daily and Continue until 06/24/2021 and stop 30 tablet 0   atorvastatin (LIPITOR) 80 MG tablet Take 1 tablet (80 mg total) by mouth at bedtime. 30 tablet 0   clopidogrel (PLAVIX) 75 MG tablet Take 1 tablet (75 mg total) by mouth daily. 90 tablet 0   cyclobenzaprine (FLEXERIL) 10 MG tablet Take 1 tablet (10 mg total) by mouth every 8 (eight) hours as needed for muscle spasms. 30 tablet 0   Dulaglutide (TRULICITY) 0.75 MG/0.5ML SOPN      fluticasone (FLONASE) 50 MCG/ACT nasal spray Place 1 spray into both nostrils daily as needed for allergies.     levothyroxine (SYNTHROID) 88 MCG tablet Take 1 tablet (88 mcg total) by mouth daily. 30 tablet 0   linagliptin (TRADJENTA) 5 MG TABS tablet Take 1 tablet (5 mg total) by mouth daily. 30 tablet 0   loratadine (CLARITIN) 10 MG tablet Take 1 tablet (10 mg total) by mouth daily as needed for allergies. 30 tablet 0   losartan (COZAAR)  100 MG tablet Take 1 tablet (100 mg total) by mouth daily. 30 tablet 0   MOUNJARO 2.5 MG/0.5ML Pen Inject 2.5 mg into the skin once a week.     omeprazole (PRILOSEC) 40 MG capsule Take 1 capsule (40 mg total) by mouth daily. 30 capsule 0   tamsulosin (FLOMAX) 0.4 MG CAPS capsule Take 1 capsule (0.4 mg total) by mouth daily after supper. 30 capsule 0   trospium (SANCTURA) 20 MG tablet Take 20 mg by mouth 2 (two) times daily.     budesonide-formoterol (SYMBICORT) 80-4.5 MCG/ACT inhaler Inhale 2 puffs into the lungs 2 (two) times daily. (Patient not taking: Reported on 06/14/2023) 10.2 g 12   fluorometholone (FML) 0.1 % ophthalmic suspension Place 1 drop into both eyes daily. (Patient not taking: Reported  on 06/14/2023)     topiramate (TOPAMAX) 25 MG tablet Take 1 tablet (25 mg total) by mouth at bedtime. (Patient not taking: Reported on 06/14/2023) 30 tablet 0   No facility-administered medications prior to visit.    PAST MEDICAL HISTORY: Past Medical History:  Diagnosis Date   Bilateral iliac artery aneurysm (HCC)    Diabetes mellitus without complication (HCC)    HTN (hypertension)    Hyperlipidemia    Hypothyroid    Obesity    OSA (obstructive sleep apnea)    Peripheral edema     PAST SURGICAL HISTORY: Past Surgical History:  Procedure Laterality Date   LEFT HEART CATH AND CORONARY ANGIOGRAPHY  03/2019   Mild Non-obstructive CAD    FAMILY HISTORY: Family History  Problem Relation Age of Onset   Stroke Father    Gout Other    Sleep apnea Neg Hx     SOCIAL HISTORY: Social History   Socioeconomic History   Marital status: Divorced    Spouse name: Not on file   Number of children: Not on file   Years of education: Not on file   Highest education level: Not on file  Occupational History   Not on file  Tobacco Use   Smoking status: Former    Current packs/day: 0.00    Types: Cigarettes    Quit date: 05/13/2021    Years since quitting: 2.0   Smokeless tobacco: Not on file   Vaping Use   Vaping status: Never Used  Substance and Sexual Activity   Alcohol use: Not Currently   Drug use: Never   Sexual activity: Not on file  Other Topics Concern   Not on file  Social History Narrative   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  EducationWellsite geologist.  Has 6 grown children.     Social Drivers of Corporate investment banker Strain: Low Risk  (06/17/2022)   Received from [facility], [facility]   Overall Financial Resource Strain (CARDIA)    Difficulty of Paying Living Expenses: Not very hard  Food Insecurity: No Food Insecurity (06/17/2022)   Received from [facility], [facility]   Hunger Vital Sign    Worried About Running Out of Food in the Last Year: Never true    Ran Out of Food in the Last Year: Never true  Transportation Needs: No Transportation Needs (06/17/2022)   Received from [facility], [facility]   PRAPARE - Transportation    Lack of Transportation (Medical): No    Lack of Transportation (Non-Medical): No  Physical Activity: Unknown (06/17/2022)   Received from [facility], [facility]   Exercise Vital Sign    Days of Exercise per Week: 0 days    Minutes of Exercise per Session: Not on file  Stress: No Stress Concern Present (06/17/2022)   Received from [facility], [facility] - Occupational Stress Questionnaire    Feeling of Stress : Not at all  Social Connections: Moderately Integrated (06/17/2022)   Received from [facility], [facility]   Social Network    How would you rate your social network (family, work, friends)?: Adequate participation with social networks  Intimate Partner Violence: Not At Risk (06/17/2022)   Received from [facility], [facility]   HITS    Over the last 12 months how often did your partner physically hurt you?: Never    Over the last 12 months how often did your partner insult you or talk  down to you?: Never    Over the last 12 months how often did  your partner threaten you with physical harm?: Never    Over the last 12 months how often did your partner scream or curse at you?: Never    PHYSICAL EXAM  Vitals:   06/14/23 1405  BP: 138/75  Pulse: 88  Weight: 248 lb (112.5 kg)  Height: 5\' 5"  (1.651 m)   Body mass index is 41.27 kg/m.  Generalized: Well developed, in no acute distress, obese elderly male Neurological examination  Mentation: Alert oriented to time, place, history taking. Follows all commands speech and language fluent via Spanish interpreter. Cranial nerve II-XII: Pupils were equal round reactive to light. Extraocular movements were full, visual field were full on confrontational test. Facial sensation and strength were normal. Head turning and shoulder shrug  were normal and symmetric. Motor: The motor testing reveals 5 over 5 strength of all 4 extremities. Good symmetric motor tone is noted throughout.  Sensory: Sensory testing is intact to soft touch on all 4 extremities. No evidence of extinction is noted.  Gait and station: Gait is wide-based, uses cane  DIAGNOSTIC DATA (LABS, IMAGING, TESTING) - I reviewed patient records, labs, notes, testing and imaging myself where available.  Lab Results  Component Value Date   WBC 9.2 03/26/2021   HGB 15.0 03/26/2021   HCT 45.4 03/26/2021   MCV 90.8 03/26/2021   PLT 197 03/26/2021      Component Value Date/Time   NA 137 03/26/2021 0508   K 4.0 03/26/2021 0508   CL 103 03/26/2021 0508   CO2 26 03/26/2021 0508   GLUCOSE 143 (H) 03/26/2021 0508   BUN 14 03/26/2021 0508   CREATININE 0.95 04/01/2021 0600   CALCIUM 8.7 (L) 03/26/2021 0508   PROT 6.1 (L) 03/26/2021 0508   ALBUMIN 2.9 (L) 03/26/2021 0508   AST 36 03/26/2021 0508   ALT 51 (H) 03/26/2021 0508   ALKPHOS 65 03/26/2021 0508   BILITOT 0.8 03/26/2021 0508   GFRNONAA >60 04/01/2021 0600   Lab Results  Component Value Date   CHOL 131 03/22/2021   HDL 39 (L) 03/22/2021   LDLCALC 72 03/22/2021    TRIG 99 03/22/2021   CHOLHDL 3.4 03/22/2021   Lab Results  Component Value Date   HGBA1C 8.4 (H) 03/22/2021   No results found for: "VITAMINB12" No results found for: "TSH"  Margie Ege, AGNP-C, DNP 06/14/2023, 2:25 PM [facility] [address] [place], [place] [address] [phone]

## 2023-06-14 ENCOUNTER — Ambulatory Visit (INDEPENDENT_AMBULATORY_CARE_PROVIDER_SITE_OTHER): Payer: 59 | Admitting: Neurology

## 2023-06-14 ENCOUNTER — Encounter: Payer: Self-pay | Admitting: Neurology

## 2023-06-14 VITALS — BP 138/75 | HR 88 | Ht 65.0 in | Wt 248.0 lb

## 2023-06-14 DIAGNOSIS — G4733 Obstructive sleep apnea (adult) (pediatric): Secondary | ICD-10-CM

## 2023-06-14 NOTE — Progress Notes (Signed)
 Samuel Norris

## 2023-06-14 NOTE — Patient Instructions (Signed)
 Recommend using CPAP nightly minimum 4 hours, dry increasing humidity, use Biotene spray for dry mouth, avoid sleeping on your back. Follow up in 6 months for CPAP

## 2023-11-12 IMAGING — CT CT ANGIO HEAD-NECK (W OR W/O PERF)
1 of 11 series · 14 of 47 positions shown · IV contrast (omnipaque)
Comparison: Brain MRI and intracranial MRA yesterday. Head CT
yesterday.

CLINICAL DATA: 85-year-old male with neurologic deficit. Acute left
cerebellar infarct, abnormal distal left vertebral artery.

EXAM:
CT ANGIOGRAPHY HEAD AND NECK
TECHNIQUE: Multidetector CT imaging of the head and neck was performed using
the standard protocol during bolus administration of intravenous
contrast. Multiplanar CT image reconstructions and MIPs were
obtained to evaluate the vascular anatomy. Carotid stenosis
measurements (when applicable) are obtained utilizing NASCET
criteria, using the distal internal carotid diameter as the
denominator.
CONTRAST:  75mL OMNIPAQUE IOHEXOL 350 MG/ML SOLN

[Series 18: thin · axial · 0.53mm/px · z∈[-239,+52]mm · 14 of 671 slices shown]
[im 45/671  brain]
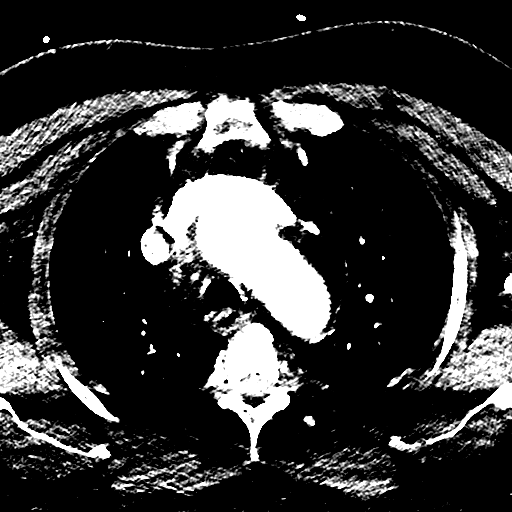
[im 90/671  bone]
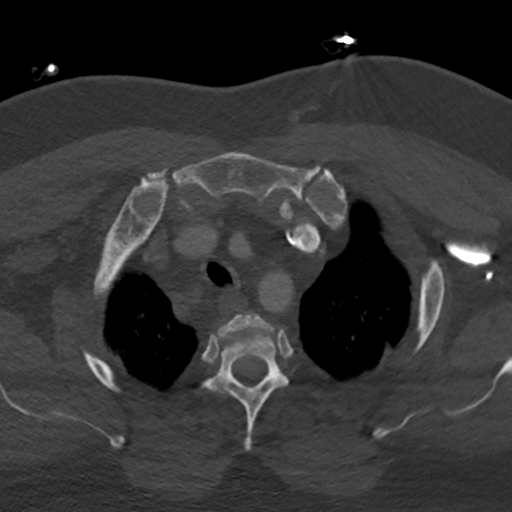
[im 135/671  brain]
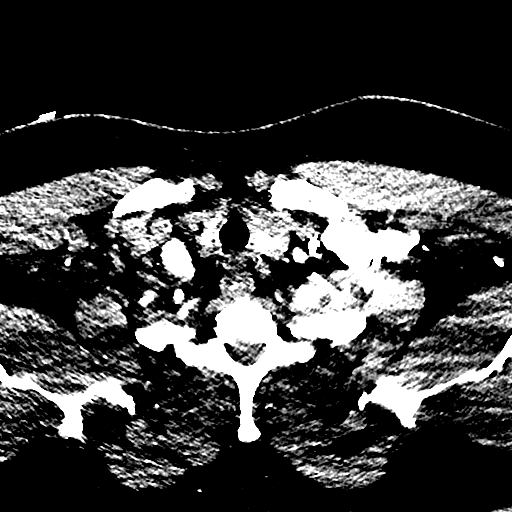
[im 179/671  bone]
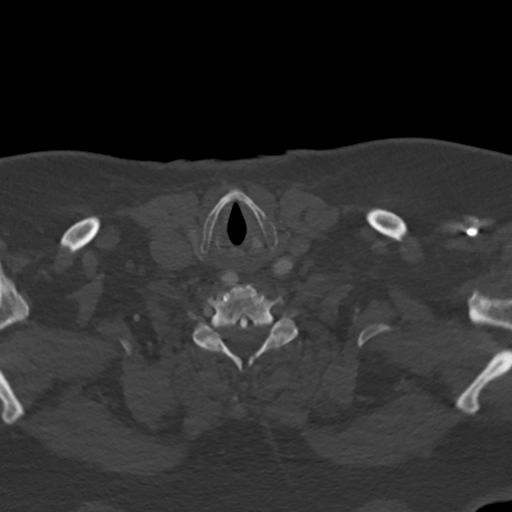
[im 224/671  brain]
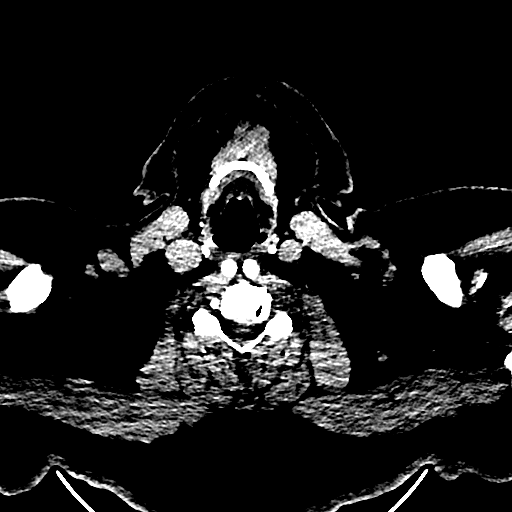
[im 269/671  bone]
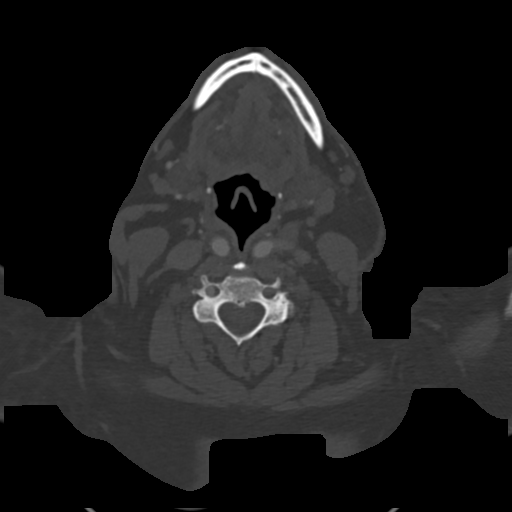
[im 313/671  brain]
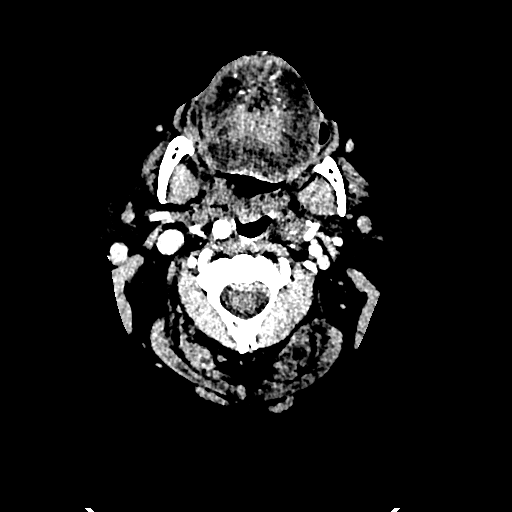
[im 358/671  bone]
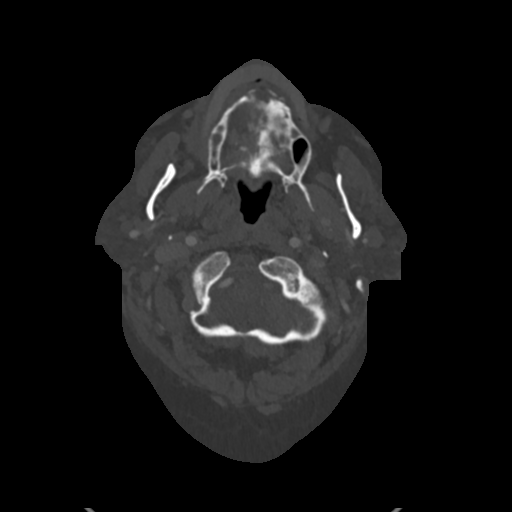
[im 403/671  brain]
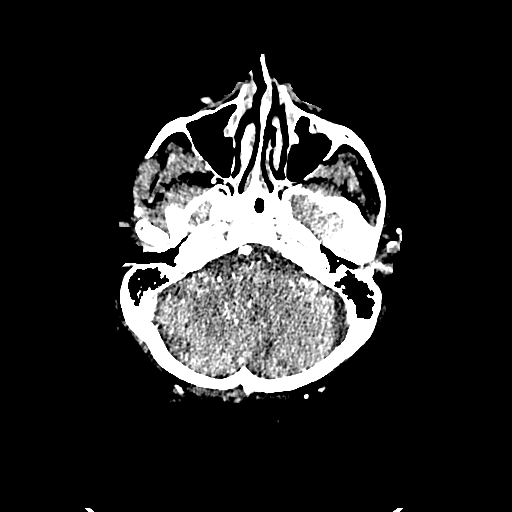
[im 447/671  bone]
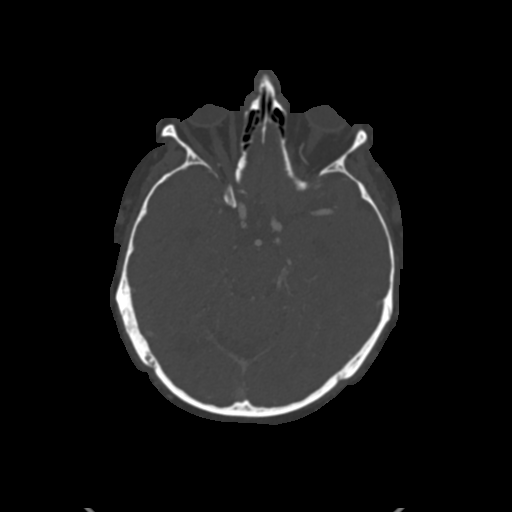
[im 492/671  brain]
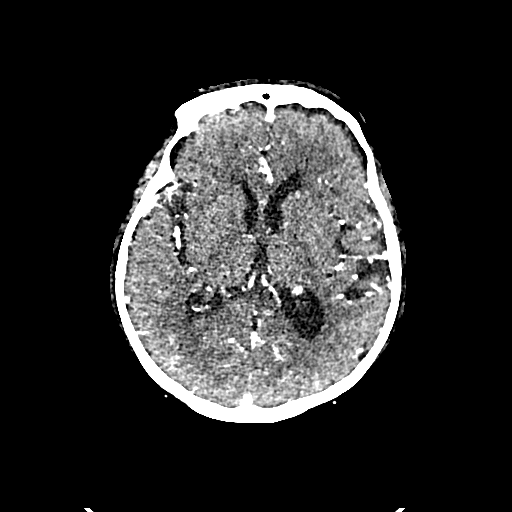
[im 537/671  bone]
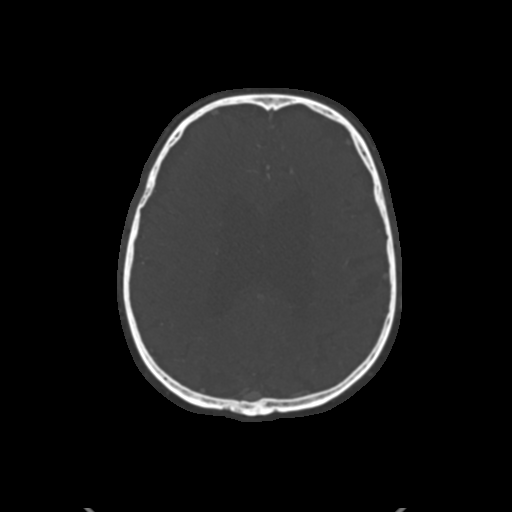
[im 581/671  brain]
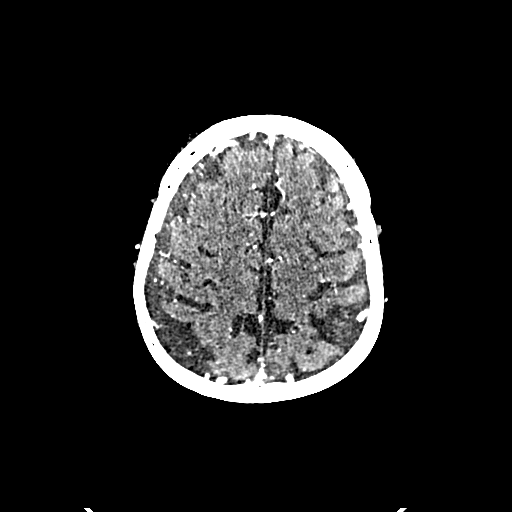
[im 626/671  bone]
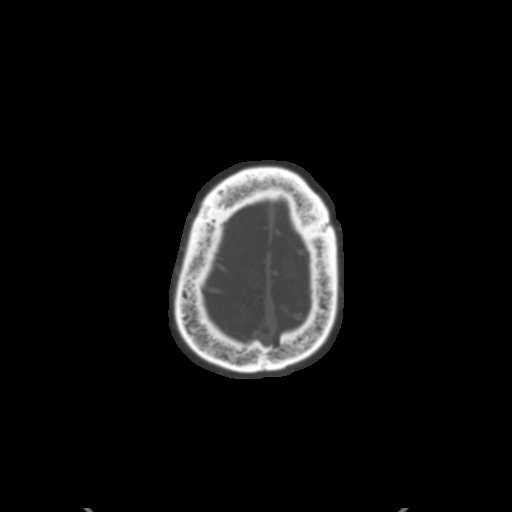

[14 of 47 positions shown; findings below may reference images not displayed]

FINDINGS: CT HEAD

Brain: Cytotoxic edema now visible in the medial left cerebellum. No
associated hemorrhage. No posterior fossa mass effect. Superimposed
small chronic left cerebellar infarct.

Chronic encephalomalacia medial right PCA territory. Stable
gray-white matter differentiation elsewhere.

No midline shift, ventriculomegaly, mass effect, evidence of mass
lesion, intracranial hemorrhage or new cortically based acute
infarction.

Calvarium and skull base: Negative.

Paranasal sinuses: Visualized paranasal sinuses and mastoids are
stable and well aerated.

Orbits: No acute orbit or scalp soft tissue finding.

CTA NECK

Skeleton: Absent dentition. Multilevel cervical spine degeneration.
No acute osseous abnormality identified.

Upper chest: Superior mediastinal lipomatosis. No superior
mediastinal lymphadenopathy. Mild mosaic attenuation in the upper
lungs likely mild atelectasis and gas trapping. Atelectatic changes
to the trachea at the thoracic inlet.

Other neck: Retropharyngeal course of both carotid arteries (normal
variant). No acute neck soft tissue finding.

Aortic arch: 3 vessel arch configuration. Mild calcified arch
atherosclerosis.

Right carotid system: Tortuous brachiocephalic artery and proximal
right CCA without plaque or stenosis. Retropharyngeal course of the
right carotid. Minor calcified plaque at the posterior right ICA
origin without stenosis. Tortuous right ICA distal to the bulb.

Left carotid system: Tortuous proximal left CCA and retropharyngeal
course without stenosis. Negative left carotid bifurcation. No
stenosis.

Vertebral arteries:
Calcified plaque and tortuosity in the proximal right subclavian
artery without hemodynamically significant stenosis. Normal right
vertebral artery origin. Right vertebral artery is patent and normal
to the skull base.

Proximal left subclavian artery is patent with mild ectasia and
minimal atherosclerosis. Left vertebral artery is occluded just
distal to its origin in the V1 segment (series 18, image 534). No
left vertebral artery reconstitution to the skull base.

CTA HEAD

Posterior circulation: Right vertebral V4 segment is patent to the
basilar without stenosis. Right AICA appears dominant.

There is partial retrograde reconstitution of the left vertebral V4
segment and the left PICA origin remains patent.

Patent basilar artery without stenosis. AICA, SCA and PCA origins
are patent. Posterior communicating arteries are diminutive or
absent. Bilateral PCA branches are patent, but there is a short
segment mild to moderate stenosis of the right P2 on series 14,
image 27.

Anterior circulation: Both ICA siphons are patent and mildly
tortuous. Minimal siphon plaque. No ICA siphon stenosis. Patent
carotid termini, MCA and ACA origins with mild tortuosity.
Diminutive or absent anterior communicating artery. Bilateral MCA
and ACA branches are patent with mild tortuosity and no significant
stenosis.

Venous sinuses: Patent.

Anatomic variants: None.

Review of the MIP images confirms the above findings
IMPRESSION: 1. Positive for occluded Left Vertebral Artery just beyond its
origin. Only partial reconstituted V4 segment but the left PICA
origin appears to remain patent.

2. Mild to moderate right PCA P2 segment stenosis. But no other
significant arterial stenosis in the head or neck. Generalized
arterial tortuosity. Retrograde course of the carotids.

3. Expected CT appearance of the Left cerebellar infarct. No
associated hemorrhage or mass effect.

4. No new intracranial abnormality.

These results were communicated to Dr. Kwon at [DATE] on 03/22/2021 by
text page via the [employer] messaging system.

## 2024-01-10 NOTE — Progress Notes (Deleted)
 Patient: Samuel Norris Date of Birth: Aug 12, 1935  Reason for Visit: Follow up History from: Patient, daughter, interpreter Primary Neurologist: Samuel Norris   ASSESSMENT AND PLAN 88 y.o. year old male   1.  Severe OSA  2.  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  We reviewed HST in June 2023 showing severe OSA with total AHI 68/hour.  He is motivated to resume nightly CPAP use for minimum 4 hours.  He understands the risk of untreated sleep apnea.  I recommended increasing the humidity, considering a biotin spray for moisture.  If he can use his CPAP consistently I can further review the data to see if any other adjustments need to be made.  For now, we will continue current settings.  He will follow-up in 6 months or sooner if needed.  He is a network engineer.  HISTORY OF PRESENT ILLNESS: Today 01/10/24 01/11/24 SS:   06/14/23 SS: Review of CPAP data of the last 90 days shows AHI 4.2, leak 27, usage 28%, greater than 4 hours 0%.  6 to 14 cm water. Here with daughter and interpreter. CPAP use is not doing well. He reports dry mouth with CPAP, he is not using it the entire night. Using FFM. He tried a nasal mask, didn't work for him.   09/15/22 SS: Here today for follow-up.  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  As result, referred to [name] for sleep consult.  HST June 2023 showed severe OSA with AHI 63/hour.  Started AutoPap since August 2023.  Via Spanish interpreter and his daughter.  He did not bring his CPAP card. Cannot pull a download electronically. Uses full face mask. He doesn't like CPAP. He gets runny nose, feels like suffocating. Doesn't use nightly. Hasn't used in several months. The full mask is better than the nasal mask in regards to his runny nose. Has runny nose regardless of CPAP. Uses nasal spray. Hasn't mentioned to PCP. When using CPAP daughter  felt his energy was better.   HISTORY 01/11/22 [name]: Mr. Samuel Norris is an 88 year old right-handed gentleman with an underlying medical history of hypertension, hyperlipidemia, diabetes, stroke, iliac artery aneurysm, peripheral edema, hypothyroidism and severe obesity with a BMI of over 40, who presents for follow-up consultation of his obstructive sleep apnea after interim testing and starting AutoPap therapy.  The patient is accompanied by his daughter, Samuel Norris and a Spanish interpreter today first met him at the request of [name] on 08/06/2021, at which time he reported a prior diagnosis of sleep apnea but he had not used his CPAP due to difficulty tolerating it.  I suggested we proceed with a home sleep test.  He had a home sleep test on 09/08/2021 which showed severe obstructive sleep apnea with a total AHI of 68/hour and O2 nadir of 68% with significant time below or at 88% saturation of over 40 minutes for the night, indicating nocturnal hypoxemia.  Snoring was noted, ranging from mild to louder.  He was advised to proceed with AutoPap therapy at home.  His set up date was 11/11/2021.  He has a ResMed AirSense 10 AutoSet machine with card reader.   Today, 01/11/2022: I reviewed his AutoPap compliance data from the most recent 30 days from 12/12/2021 through 01/10/2022, during which time he used his machine every night with percent use days greater than 4 hours at 30% only, indicating suboptimal compliance,  average usage of 3 hours and 52 minutes, residual AHI borderline at 5.8/h, leak on the higher side with the 95th percentile at 26.4 L/min.  His compliance for more than 4 hours was better in the first month, compliance from 11/11/2021 through 12/10/2021 showed percent use days greater than 4 hours at 73%, indicating adequate compliance with an average usage of 5 hours and 12 minutes, residual AHI was higher at the time at 8.1/h, leak also a lot higher with the 95th percentile at 38 L/min.   In the  beginning he had a nasal mask per daughter.  He then switched to a fullface mask.  He reports that he was more excited about using his CPAP in the beginning but is motivated to continue with treatment.  He also used his AutoPap when he was napping in the first month.  He is noticing better ability to bring it with a full facemask as he had a lot of nasal congestion and drainage, almost felt like he had flulike symptoms when he first started treatment.  He is able to breathe through his mouth but does have some mouth dryness also.  He uses the humidifier with distilled water.  REVIEW OF SYSTEMS: Out of a complete 14 system review of symptoms, the patient complains only of the following symptoms, and all other reviewed systems are negative.  See HPI  ALLERGIES: No Known Allergies  HOME MEDICATIONS: Outpatient Medications Prior to Visit  Medication Sig Dispense Refill   acetaminophen  (TYLENOL ) 325 MG tablet Take 2 tablets (650 mg total) by mouth every 4 (four) hours as needed for mild pain (or temp > 37.5 C (99.5 F)).     aspirin  325 MG EC tablet take 1 tablet daily and Continue until 06/24/2021 and stop 30 tablet 0   atorvastatin  (LIPITOR ) 80 MG tablet Take 1 tablet (80 mg total) by mouth at bedtime. 30 tablet 0   budesonide -formoterol  (SYMBICORT ) 80-4.5 MCG/ACT inhaler Inhale 2 puffs into the lungs 2 (two) times daily. (Patient not taking: Reported on 06/14/2023) 10.2 g 12   clopidogrel  (PLAVIX ) 75 MG tablet Take 1 tablet (75 mg total) by mouth daily. 90 tablet 0   cyclobenzaprine  (FLEXERIL ) 10 MG tablet Take 1 tablet (10 mg total) by mouth every 8 (eight) hours as needed for muscle spasms. 30 tablet 0   Dulaglutide (TRULICITY) 0.75 MG/0.5ML SOPN      fluorometholone  (FML) 0.1 % ophthalmic suspension Place 1 drop into both eyes daily. (Patient not taking: Reported on 06/14/2023)     fluticasone  (FLONASE ) 50 MCG/ACT nasal spray Place 1 spray into both nostrils daily as needed for allergies.      levothyroxine  (SYNTHROID ) 88 MCG tablet Take 1 tablet (88 mcg total) by mouth daily. 30 tablet 0   linagliptin  (TRADJENTA ) 5 MG TABS tablet Take 1 tablet (5 mg total) by mouth daily. 30 tablet 0   loratadine  (CLARITIN ) 10 MG tablet Take 1 tablet (10 mg total) by mouth daily as needed for allergies. 30 tablet 0   losartan  (COZAAR ) 100 MG tablet Take 1 tablet (100 mg total) by mouth daily. 30 tablet 0   MOUNJARO 2.5 MG/0.5ML Pen Inject 2.5 mg into the skin once a week.     omeprazole  (PRILOSEC) 40 MG capsule Take 1 capsule (40 mg total) by mouth daily. 30 capsule 0   tamsulosin  (FLOMAX ) 0.4 MG CAPS capsule Take 1 capsule (0.4 mg total) by mouth daily after supper. 30 capsule 0   topiramate  (TOPAMAX ) 25 MG tablet Take  1 tablet (25 mg total) by mouth at bedtime. (Patient not taking: Reported on 06/14/2023) 30 tablet 0   trospium (SANCTURA) 20 MG tablet Take 20 mg by mouth 2 (two) times daily.     No facility-administered medications prior to visit.    PAST MEDICAL HISTORY: Past Medical History:  Diagnosis Date   Bilateral iliac artery aneurysm    Diabetes mellitus without complication (HCC)    HTN (hypertension)    Hyperlipidemia    Hypothyroid    Obesity    OSA (obstructive sleep apnea)    Peripheral edema     PAST SURGICAL HISTORY: Past Surgical History:  Procedure Laterality Date   LEFT HEART CATH AND CORONARY ANGIOGRAPHY  03/2019   Mild Non-obstructive CAD    FAMILY HISTORY: Family History  Problem Relation Age of Onset   Stroke Father    Gout Other    Sleep apnea Neg Hx     SOCIAL HISTORY: Social History   Socioeconomic History   Marital status: Divorced    Spouse name: Not on file   Number of children: Not on file   Years of education: Not on file   Highest education level: Not on file  Occupational History   Not on file  Tobacco Use   Smoking status: Former    Current packs/day: 0.00    Types: Cigarettes    Quit date: 05/13/2021    Years since quitting: 2.6    Smokeless tobacco: Not on file  Vaping Use   Vaping status: Never Used  Substance and Sexual Activity   Alcohol use: Not Currently   Drug use: Never   Sexual activity: Not on file  Other Topics Concern   Not on file  Social History Narrative   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     Social Drivers of Health   Financial Resource Strain: Medium Risk (07/18/2023)   Received from [facility]   Overall Financial Resource Strain (CARDIA)    Difficulty of Paying Living Expenses: Somewhat hard  Food Insecurity: No Food Insecurity (07/18/2023)   Received from [facility]   Hunger Vital Sign    Within the past 12 months, you worried that your food would run out before you got the money to buy more.: Never true    Within the past 12 months, the food you bought just didn't last and you didn't have money to get more.: Never true  Transportation Needs: No Transportation Needs (07/18/2023)   Received from [facility] - Transportation    Lack of Transportation (Medical): No    Lack of Transportation (Non-Medical): No  Physical Activity: Unknown (07/18/2023)   Received from [facility]   Exercise Vital Sign    On average, how many days per week do you engage in moderate to strenuous exercise (like a brisk walk)?: 0 days    Minutes of Exercise per Session: Not on file  Stress: No Stress Concern Present (07/18/2023)   Received from [facility] - Occupational Stress Questionnaire    Feeling of Stress : Not at all  Social Connections: Socially Integrated (07/18/2023)   Received from [facility]   Social Network    How would you rate your social network (family, work, friends)?: Good participation with social networks  Intimate Partner Violence: Not At Risk (07/18/2023)   Received from [facility]   HITS    Over the last 12 months  how often did your partner physically hurt you?: Never    Over the  last 12 months how often did your partner insult you or talk down to you?: Never    Over the last 12 months how often did your partner threaten you with physical harm?: Never    Over the last 12 months how often did your partner scream or curse at you?: Never    PHYSICAL EXAM  There were no vitals filed for this visit.  There is no height or weight on file to calculate BMI.  Generalized: Well developed, in no acute distress, obese elderly male Neurological examination  Mentation: Alert oriented to time, place, history taking. Follows all commands speech and language fluent via Spanish interpreter. Cranial nerve II-XII: Pupils were equal round reactive to light. Extraocular movements were full, visual field were full on confrontational test. Facial sensation and strength were normal. Head turning and shoulder shrug  were normal and symmetric. Motor: The motor testing reveals 5 over 5 strength of all 4 extremities. Good symmetric motor tone is noted throughout.  Sensory: Sensory testing is intact to soft touch on all 4 extremities. No evidence of extinction is noted.  Gait and station: Gait is wide-based, uses cane  DIAGNOSTIC DATA (LABS, IMAGING, TESTING) - I reviewed patient records, labs, notes, testing and imaging myself where available.  Lab Results  Component Value Date   WBC 9.2 03/26/2021   HGB 15.0 03/26/2021   HCT 45.4 03/26/2021   MCV 90.8 03/26/2021   PLT 197 03/26/2021      Component Value Date/Time   NA 137 03/26/2021 0508   K 4.0 03/26/2021 0508   CL 103 03/26/2021 0508   CO2 26 03/26/2021 0508   GLUCOSE 143 (H) 03/26/2021 0508   BUN 14 03/26/2021 0508   CREATININE 0.95 04/01/2021 0600   CALCIUM  8.7 (L) 03/26/2021 0508   PROT 6.1 (L) 03/26/2021 0508   ALBUMIN 2.9 (L) 03/26/2021 0508   AST 36 03/26/2021 0508   ALT 51 (H) 03/26/2021 0508   ALKPHOS 65 03/26/2021 0508   BILITOT 0.8 03/26/2021 0508   GFRNONAA >60 04/01/2021 0600   Lab Results  Component Value  Date   CHOL 131 03/22/2021   HDL 39 (L) 03/22/2021   LDLCALC 72 03/22/2021   TRIG 99 03/22/2021   CHOLHDL 3.4 03/22/2021   Lab Results  Component Value Date   HGBA1C 8.4 (H) 03/22/2021   No results found for: VITAMINB12 No results found for: TSH  Lauraine Born, AGNP-C, DNP 01/10/2024, 4:25 PM [facility] [address] [place], [place] [address] [phone]

## 2024-01-11 ENCOUNTER — Ambulatory Visit: Admitting: Neurology

## 2024-01-11 ENCOUNTER — Encounter: Payer: Self-pay | Admitting: Neurology
